# Patient Record
Sex: Female | Born: 1975 | Race: White | Hispanic: No | Marital: Married | Smoking: Never smoker
Health system: Southern US, Community
[De-identification: ages and names within clinical notes are randomized; demographics above are authoritative.]

## PROBLEM LIST (undated history)

## (undated) ENCOUNTER — Inpatient Hospital Stay (HOSPITAL_COMMUNITY): Payer: Self-pay

## (undated) DIAGNOSIS — S82001A Unspecified fracture of right patella, initial encounter for closed fracture: Secondary | ICD-10-CM

## (undated) DIAGNOSIS — R112 Nausea with vomiting, unspecified: Secondary | ICD-10-CM

## (undated) DIAGNOSIS — F329 Major depressive disorder, single episode, unspecified: Secondary | ICD-10-CM

## (undated) DIAGNOSIS — F419 Anxiety disorder, unspecified: Secondary | ICD-10-CM

## (undated) DIAGNOSIS — K5909 Other constipation: Secondary | ICD-10-CM

## (undated) DIAGNOSIS — F32A Depression, unspecified: Secondary | ICD-10-CM

## (undated) DIAGNOSIS — IMO0002 Reserved for concepts with insufficient information to code with codable children: Secondary | ICD-10-CM

## (undated) DIAGNOSIS — T4145XA Adverse effect of unspecified anesthetic, initial encounter: Secondary | ICD-10-CM

## (undated) DIAGNOSIS — Z973 Presence of spectacles and contact lenses: Secondary | ICD-10-CM

## (undated) DIAGNOSIS — Z9889 Other specified postprocedural states: Secondary | ICD-10-CM

## (undated) DIAGNOSIS — T8859XA Other complications of anesthesia, initial encounter: Secondary | ICD-10-CM

## (undated) HISTORY — PX: DILATION AND CURETTAGE OF UTERUS: SHX78

## (undated) HISTORY — PX: WISDOM TOOTH EXTRACTION: SHX21

## (undated) HISTORY — PX: TUBAL LIGATION: SHX77

## (undated) HISTORY — PX: CHOLECYSTECTOMY: SHX55

## (undated) HISTORY — PX: OTHER SURGICAL HISTORY: SHX169

---
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
## 2000-10-19 ENCOUNTER — Emergency Department (HOSPITAL_COMMUNITY): Admission: EM | Admit: 2000-10-19 | Discharge: 2000-10-19 | Payer: Self-pay | Admitting: Emergency Medicine

## 2000-10-25 ENCOUNTER — Ambulatory Visit (HOSPITAL_COMMUNITY): Admission: RE | Admit: 2000-10-25 | Discharge: 2000-10-25 | Payer: Self-pay | Admitting: Family Medicine

## 2000-10-25 ENCOUNTER — Encounter: Payer: Self-pay | Admitting: Unknown Physician Specialty

## 2001-05-29 ENCOUNTER — Emergency Department (HOSPITAL_COMMUNITY): Admission: EM | Admit: 2001-05-29 | Discharge: 2001-05-30 | Payer: Self-pay | Admitting: *Deleted

## 2002-03-09 ENCOUNTER — Emergency Department (HOSPITAL_COMMUNITY): Admission: EM | Admit: 2002-03-09 | Discharge: 2002-03-09 | Payer: Self-pay | Admitting: *Deleted

## 2002-11-15 ENCOUNTER — Emergency Department (HOSPITAL_COMMUNITY): Admission: EM | Admit: 2002-11-15 | Discharge: 2002-11-15 | Payer: Self-pay | Admitting: Emergency Medicine

## 2002-11-15 ENCOUNTER — Encounter: Payer: Self-pay | Admitting: Emergency Medicine

## 2002-11-22 ENCOUNTER — Other Ambulatory Visit: Admission: RE | Admit: 2002-11-22 | Discharge: 2002-11-22 | Payer: Self-pay | Admitting: Dermatology

## 2002-12-18 ENCOUNTER — Emergency Department (HOSPITAL_COMMUNITY): Admission: EM | Admit: 2002-12-18 | Discharge: 2002-12-18 | Payer: Self-pay | Admitting: Emergency Medicine

## 2003-03-05 ENCOUNTER — Emergency Department (HOSPITAL_COMMUNITY): Admission: EM | Admit: 2003-03-05 | Discharge: 2003-03-05 | Payer: Self-pay | Admitting: Emergency Medicine

## 2003-03-30 ENCOUNTER — Ambulatory Visit (HOSPITAL_COMMUNITY): Admission: AD | Admit: 2003-03-30 | Discharge: 2003-03-30 | Payer: Self-pay | Admitting: Obstetrics and Gynecology

## 2003-04-04 ENCOUNTER — Ambulatory Visit (HOSPITAL_COMMUNITY): Admission: AD | Admit: 2003-04-04 | Discharge: 2003-04-04 | Payer: Self-pay | Admitting: Obstetrics and Gynecology

## 2003-04-13 ENCOUNTER — Ambulatory Visit (HOSPITAL_COMMUNITY): Admission: AD | Admit: 2003-04-13 | Discharge: 2003-04-13 | Payer: Self-pay | Admitting: Internal Medicine

## 2003-04-14 ENCOUNTER — Observation Stay (HOSPITAL_COMMUNITY): Admission: AD | Admit: 2003-04-14 | Discharge: 2003-04-15 | Payer: Self-pay | Admitting: Obstetrics and Gynecology

## 2003-11-13 ENCOUNTER — Inpatient Hospital Stay (HOSPITAL_COMMUNITY): Admission: RE | Admit: 2003-11-13 | Discharge: 2003-11-15 | Payer: Self-pay | Admitting: Obstetrics and Gynecology

## 2004-09-05 ENCOUNTER — Emergency Department (HOSPITAL_COMMUNITY): Admission: EM | Admit: 2004-09-05 | Discharge: 2004-09-05 | Payer: Self-pay | Admitting: Emergency Medicine

## 2004-12-27 ENCOUNTER — Emergency Department (HOSPITAL_COMMUNITY): Admission: EM | Admit: 2004-12-27 | Discharge: 2004-12-27 | Payer: Self-pay | Admitting: Emergency Medicine

## 2007-06-20 ENCOUNTER — Emergency Department (HOSPITAL_COMMUNITY): Admission: EM | Admit: 2007-06-20 | Discharge: 2007-06-20 | Payer: Self-pay | Admitting: Emergency Medicine

## 2008-11-04 ENCOUNTER — Emergency Department (HOSPITAL_COMMUNITY): Admission: EM | Admit: 2008-11-04 | Discharge: 2008-11-04 | Payer: Self-pay | Admitting: Emergency Medicine

## 2009-03-22 ENCOUNTER — Emergency Department (HOSPITAL_COMMUNITY): Admission: EM | Admit: 2009-03-22 | Discharge: 2009-03-22 | Payer: Self-pay | Admitting: Emergency Medicine

## 2009-12-27 ENCOUNTER — Emergency Department (HOSPITAL_COMMUNITY): Admission: EM | Admit: 2009-12-27 | Discharge: 2009-12-27 | Payer: Self-pay | Admitting: Emergency Medicine

## 2010-09-29 LAB — URINALYSIS, ROUTINE W REFLEX MICROSCOPIC
Bilirubin Urine: NEGATIVE
Glucose, UA: NEGATIVE mg/dL
Hgb urine dipstick: NEGATIVE
Ketones, ur: NEGATIVE mg/dL
Nitrite: NEGATIVE
Protein, ur: NEGATIVE mg/dL
Specific Gravity, Urine: 1.02 (ref 1.005–1.030)
Urobilinogen, UA: 0.2 mg/dL (ref 0.0–1.0)
pH: 6.5 (ref 5.0–8.0)

## 2010-09-29 LAB — CBC
MCHC: 33.9 g/dL (ref 30.0–36.0)
RDW: 13.5 % (ref 11.5–15.5)

## 2010-09-29 LAB — COMPREHENSIVE METABOLIC PANEL
ALT: 20 U/L (ref 0–35)
AST: 22 U/L (ref 0–37)
Albumin: 4 g/dL (ref 3.5–5.2)
Calcium: 9.1 mg/dL (ref 8.4–10.5)
GFR calc Af Amer: >60 mL/min (ref >60)
Sodium: 139 mEq/L (ref 135–145)
Total Protein: 6.8 g/dL (ref 6.0–8.3)

## 2010-09-29 LAB — DIFFERENTIAL
Eosinophils Absolute: 0.1 10*3/uL (ref 0.0–0.7)
Eosinophils Relative: 1 % (ref 0–5)
Lymphs Abs: 2.3 10*3/uL (ref 0.7–4.0)
Monocytes Relative: 8 % (ref 3–12)

## 2010-10-18 LAB — HCG, QUANTITATIVE, PREGNANCY: hCG, Beta Chain, Quant, S: 37430 m[IU]/mL — ABNORMAL HIGH (ref <5)

## 2010-11-29 NOTE — H&P (Signed)
NAME:  [name], [name]                          ACCOUNT NO.:  [id number]   MEDICAL RECORD NO.:  [id number]                   PATIENT TYPE:  AMB   LOCATION:  DAY                                  FACILITY:  [facility]   PHYSICIAN:  [name], M.D.              DATE OF BIRTH:  Dec 11, 1975   DATE OF ADMISSION:  DATE OF DISCHARGE:                                HISTORY & PHYSICAL   ADMISSION DIAGNOSIS:  Pregnancy, 38-1/[redacted] weeks gestation.  Repeat cesarean  section, not for trial of labor.   HISTORY OF PRESENT ILLNESS:  This 35 year old female, gravida 4, para 2, AB  1, two prior cesarean sections, scheduled for repeat cesarean section on Nov 13, 2003.  Her menstrual EDC is Nov 22, 2003, by very certain LMP and  ultrasound [facility] is Nov 21, 2003 by both first and second trimester  ultrasounds.   Prenatal course has been notable for a 31-pound weight gain, appropriate  fundal height, blood type O positive, antibody screen negative.  Hepatitis,  HIV, GC, Chlamydia, RPR are all negative.  One-hour glucose tolerance test  90 mg/%.  The patient is bottle feeding.  Plan circumcision if it is a female,  though a female is suspected.  Contraception plans are undecided.   PAST MEDICAL HISTORY:  Benign surgical history with cholecystectomy and  cesarean section x2, the initial cesarean section for fetal distress and in  1999 repeat cesarean section performed.   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  Stable  relationship, supportive partner, Loraine Leriche, his first child.   PHYSICAL EXAMINATION:  VITAL SIGNS:  Height 5 feet 8 inches, weight 185,  blood pressure 122/62.  HEENT:  Pupils are equal, round and reactive to light.  Extraocular movement  intact.  NECK:  Supple. Trachea midline.  CHEST:  Clear to auscultation.  ABDOMEN:  Nontender.  In terms of fetus, vertex presentation.  Cervix closed  and high on last exam.   PLAN:  Repeat cesarean section on Nov 13, 2003.     ___________________________________________                                         [name], M.D.   JVF/MEDQ  D:  11/10/2003  T:  11/10/2003  Job:  [id number]   cc:   Francoise Schaumann. Halm, D.O.  [address], [address]  [place]  [place] [address]  Fax: [phone]   Scott A. Gerda Diss, M.D.  [address], [address]  [place]  [place] [address]  Fax: [phone]

## 2010-11-29 NOTE — Discharge Summary (Signed)
   NAMEMALAYNA, [name]                          ACCOUNT NO.:  [id number]   MEDICAL RECORD NO.:  [id number]                   PATIENT TYPE:  OIB   LOCATION:  A415                                 FACILITY:  [facility]   PHYSICIAN:  [name], M.D.              DATE OF BIRTH:  Dec 06, 1975   DATE OF ADMISSION:  04/14/2003  DATE OF DISCHARGE:  04/15/2003                                 DISCHARGE SUMMARY   PROCEDURE:  Observation overnight.   OBSERVATION SUMMARY:  This 35 year old female, at [redacted] weeks gestation, is  admitted from the office yesterday by Jacklyn Shell, C.N.M., for  management of dehydration with a six pound weight loss in three days.  The  patient was admitted with a temperature 97.7, pulse 87, respirations 18,  blood pressure 119/75 and urinalysis in the office showed ketones present.  She was given IV Phenergan with good antinausea effect.  She was observed  overnight, was able to tolerate a breakfast.  Went home on oral Phenergan  tablets and will followup next week in our office.      ___________________________________________                                         [name], M.D.   JVF/MEDQ  D:  04/15/2003  T:  04/16/2003  Job:  [id number]

## 2010-11-29 NOTE — Discharge Summary (Signed)
NAMEEMONII, WIENKE                          ACCOUNT NO.:  [id number]   MEDICAL RECORD NO.:  [id number]                   PATIENT TYPE:  INP   LOCATION:  A402                                 FACILITY:  [facility]   PHYSICIAN:  [name], M.D.              DATE OF BIRTH:  October 31, 1975   DATE OF ADMISSION:  11/13/2003  DATE OF DISCHARGE:                                 DISCHARGE SUMMARY   ADMITTING DIAGNOSES:  1. Pregnancy, 38-1/[redacted] weeks gestation.  2. Repeat cesarean section after trial of labor.   DISCHARGE DIAGNOSES:  Pregnancy.   HISTORY:  A 38-1/2 weeks, delivered, repeat low-transverse cervical cesarean  section, Nov 13, 2003.   DISCHARGE MEDICATIONS:  1. Tylox 15 tablets, one to two q.4h. p.r.n. moderate to severe pain.  2. Motrin 800 mg, one p.o. q.8h. p.r.n. mild pain.  3. Prenatal vitamins one p.o. daily x30 days.   FOLLOWUP:  In four weeks in our office for routine postpartum check, J.B.  Ferguson.   HOSPITAL SUMMARY:  This 35 year old female, gravida 4, para 2, AB 1, with  two prior cesarean section was scheduled for repeat cesarean section.  See  admitting history for details.   HOSPITAL COURSE:  The patient was admitted and underwent an uncomplicated,  repeat, low-transverse cervical cesarean section with removal of the old  cesarean section scars.  The patient had a 7 pound, 8 ounce female infant,  Apgars 10/10.  Intraabdominal findings including some omental adhesions to  anterior uterine wall and to the anterior abdominal wall. The bladder was  high on the uterus.   LABORATORY DATA:  Postoperative hemoglobin was 11.1, hematocrit 33.3  compared to 12.3 and 36.5 preoperatively.   DISPOSITION:  She tolerated a regular diet within 24 hours, and was stable  for discharge at 48 hours with routine postoperative instructions given,  staples removed on day 2, with followup in one month.     ___________________________________________           [name], M.D.   JVF/MEDQ  D:  11/15/2003  T:  11/15/2003  Job:  [id number]   cc:   Lorin Picket A. Gerda Diss, M.D.  [address]  [place]  [place] [address]  Fax: [phone]

## 2010-11-29 NOTE — Op Note (Signed)
[name], [name]                          ACCOUNT NO.:  [id number]   MEDICAL RECORD NO.:  [id number]                   PATIENT TYPE:  INP   LOCATION:  A402                                 FACILITY:  [facility]   PHYSICIAN:  [name], M.D.              DATE OF BIRTH:  1976-05-16   DATE OF PROCEDURE:  11/13/2003  DATE OF DISCHARGE:                                 OPERATIVE REPORT   PREOPERATIVE DIAGNOSES:  1. Pregnancy at 38-1/[redacted] weeks gestation.  2. Repeat cesarean section, not for trial of labor.   POSTOPERATIVE DIAGNOSES:  1. Pregnancy at 38-1/[redacted] weeks gestation.  2. Repeat cesarean section, not for trial of labor, delivered.   PROCEDURE:  Repeat low transverse cervical cesarean section.   SURGEON:  [name], M.D.   ASSISTANTAmie Critchley   ANESTHESIA:  Spinal.   COMPLICATIONS:  None.   ESTIMATED BLOOD LOSS:  600 cc.   FINDINGS:  A 7 pound 8 ounce female with Apgars of 10 and 10.  Omental  adhesions to anterior uterine wall to anterior abdominal wall.  Bladder high  on lower uterine segment.   SPECIMENS:  None.   INDICATIONS FOR PROCEDURE:  The patient is a 35 year old gravida 3, para 2  with two prior C-sections requesting repeat cesarean section.  The patient  plans no more children but does not request sterilization at this time.   DESCRIPTION OF PROCEDURE:  The patient was taken to the operating room and  prepped and draped in the usual sterile fashion for lower abdominal surgery.  Spinal anesthesia was introduced successfully and good analgesic effect  obtained.   The prior two scars were taken out with a 1-inch wide ellipse of skin to  leave her with one scar line.  The fascia was opened transversely in the  method of Pfannenstiel and rectus muscle split in the midline.  The lower  uterine segment was covered with the bladder being rather firmly adherent to  the lower uterine segment, but we were able to eventually develop the  bladder flap  without significant difficulty.  No bladder trauma was  suspected.  No hematuria noted.  The patient had a transverse uterine  incision extended laterally using index finger traction.  The vertex was  guided through the incision using vacuum extractor to guide the vertex while  fundal pressure was applied.  The body was delivered by using the axilla to  extract the baby and cord clamping performed.  Bulb suctioning of the  pharynx had been performed when the head was out.  The baby cried vigorously  even before the body was completely delivered.   Cord blood samples were obtained and are noted elsewhere in the chart.  The  placenta delivered from its posterior mid-uterine position and was delivered  with intact membranes, except for one small remnant which was extracted from  the lower uterine segment area.  Uterine  irrigation with antibiotic solution  was followed by single layer of running locking closure of the uterus.   Inspection of the anterior uterus near the fundus revealed a rather  extensive amount of omental adhesions both to the anterior uterus as well as  the anterior abdominal wall.  We took these down sharply, being careful to  have careful direct visualization.  It required Kelly clamps across the  omental pedicle and 2-0 chromic ligature.   The bladder flap was reapproximated, pulling it side-to-side in an effort to  reduce the tendency for the bladder flap to march higher and higher on the  uterus.  The anterior peritoneum was closed in a continuous running fashion  using 2-0 chromic, and then the fascia was closed with continuous running 0  Vicryl.  The subcutaneous fatty tissues were reapproximated using four  interrupted sutures of 2-0 plain, with antibiotic irrigation of the  subcutaneous tissue as it had been in deeper layers.  We then proceeded with  staple closure of the skin.   The patient tolerated the procedure well with very minimal blood loss,  estimated  at 600 cc or less.  Condition was good to recovery.      ___________________________________________                                            [name], M.D.   JVF/MEDQ  D:  11/13/2003  T:  11/13/2003  Job:  [id number]   cc:   Lorin Picket A. Gerda Diss, M.D.  [address], [address]  [place]  [place] [address]  Fax: [phone]   Francoise Schaumann. Halm, D.O.  [address]  [place]  [place] [address]  Fax: [phone]

## 2010-12-02 ENCOUNTER — Emergency Department (HOSPITAL_COMMUNITY)
Admission: EM | Admit: 2010-12-02 | Discharge: 2010-12-02 | Disposition: A | Discharge status: Home / Self Care | Payer: Self-pay | Attending: Emergency Medicine | Admitting: Emergency Medicine

## 2010-12-02 DIAGNOSIS — H6690 Otitis media, unspecified, unspecified ear: Secondary | ICD-10-CM | POA: Insufficient documentation

## 2011-03-31 ENCOUNTER — Encounter: Payer: Self-pay | Admitting: *Deleted

## 2011-03-31 ENCOUNTER — Observation Stay (HOSPITAL_COMMUNITY)
Admission: EM | Admit: 2011-03-31 | Discharge: 2011-04-01 | DRG: 781 | Disposition: A | Discharge status: Home / Self Care | Payer: Medicaid Other | Attending: Obstetrics and Gynecology | Admitting: Obstetrics and Gynecology

## 2011-03-31 DIAGNOSIS — E860 Dehydration: Secondary | ICD-10-CM | POA: Diagnosis present

## 2011-03-31 DIAGNOSIS — O211 Hyperemesis gravidarum with metabolic disturbance: Principal | ICD-10-CM | POA: Diagnosis present

## 2011-03-31 LAB — BASIC METABOLIC PANEL
CO2: 20 mEq/L (ref 19–32)
Chloride: 96 mEq/L (ref 96–112)
Creatinine, Ser: 0.69 mg/dL (ref 0.50–1.10)
Potassium: 3.9 mEq/L (ref 3.5–5.1)

## 2011-03-31 LAB — CBC
MCV: 86.7 fL (ref 78.0–100.0)
Platelets: 257 10*3/uL (ref 150–400)
RBC: 4.87 MIL/uL (ref 3.87–5.11)
WBC: 8 10*3/uL (ref 4.0–10.5)

## 2011-03-31 LAB — URINE MICROSCOPIC-ADD ON

## 2011-03-31 LAB — URINALYSIS, ROUTINE W REFLEX MICROSCOPIC
Glucose, UA: NEGATIVE mg/dL
Specific Gravity, Urine: >1.03 — ABNORMAL HIGH (ref 1.005–1.030)
Urobilinogen, UA: 0.2 mg/dL (ref 0.0–1.0)

## 2011-03-31 MED ORDER — ONDANSETRON HCL 4 MG/2ML IJ SOLN
4.0000 mg | Freq: Once | INTRAMUSCULAR | Status: AC
  Administered 2011-03-31: 4 mg via INTRAVENOUS
  Filled 2011-03-31: qty 2

## 2011-03-31 MED ORDER — SODIUM CHLORIDE 0.9 % IV BOLUS (SEPSIS)
1000.0000 mL | Freq: Once | INTRAVENOUS | Status: AC
  Administered 2011-03-31: 1000 mL via INTRAVENOUS

## 2011-03-31 NOTE — ED Provider Notes (Addendum)
History     CSN: [id number] Arrival date & time: 03/31/2011  9:37 PM   Chief Complaint  Patient presents with  . Emesis During Pregnancy     (Include location/radiation/quality/duration/timing/severity/associated sxs/prior treatment) Patient is a 35 y.o. female presenting with vomiting. The history is provided by the patient.  Emesis  This is a new problem. The current episode started more than 2 days ago. The problem occurs 5 to 10 times per day. The problem has not changed since onset.The emesis has an appearance of stomach contents. There has been no fever. Pertinent negatives include no abdominal pain, no chills, no cough, no diarrhea, no fever, no headaches and no URI.   Recently found out that she is pregnant followed by OB GYN and has scheduled [place]. Taking PO meds without relief.  She is G4P3 and had similar symptoms with each pregnancy and denies any other complications of pregnancy. No vag bleeding or ABD pains. Did have some intermittent cramping/ none in the ED. She believes she is about [redacted] weeks pregnant based on LMP  Past Medical History  Diagnosis Date  . Pregnant state, incidental      Past Surgical History  Procedure Date  . Cholecystectomy   . C sections     No family history on file.  History  Substance Use Topics  . Smoking status: Not on file  . Smokeless tobacco: Not on file  . Alcohol Use: No    OB History    Grav Para Term Preterm Abortions TAB SAB Ect Mult Living   1               Review of Systems  Constitutional: Negative for fever and chills.  HENT: Negative for neck pain and neck stiffness.   Eyes: Negative for pain.  Respiratory: Negative for cough and shortness of breath.   Cardiovascular: Negative for chest pain, palpitations and leg swelling.  Gastrointestinal: Positive for nausea and vomiting. Negative for abdominal pain, diarrhea, constipation and blood in stool.  Genitourinary: Negative for dysuria.  Musculoskeletal: Negative for  back pain.  Skin: Negative for rash.  Neurological: Negative for headaches.  All other systems reviewed and are negative.    Allergies  Review of patient's allergies indicates no known allergies.  Home Medications   Current Outpatient Rx  Name Route Sig Dispense Refill  . ONDANSETRON HCL 4 MG PO TABS Oral Take 4 mg by mouth every 8 (eight) hours as needed.      Marland Kitchen PROMETHAZINE HCL 25 MG RE SUPP Rectal Place 25 mg rectally every 6 (six) hours as needed.        Physical Exam    BP 116/86  Pulse 92  Temp(Src) 98 F (36.7 C) (Oral)  Resp 20  Ht 5\' 8"  (1.727 m)  Wt 155 lb (70.308 kg)  BMI 23.57 kg/m2  SpO2 99%  Physical Exam  Constitutional: She is oriented to person, place, and time. She appears well-developed and well-nourished.  HENT:  Head: Normocephalic and atraumatic.  Eyes: Conjunctivae and EOM are normal. Pupils are equal, round, and reactive to light.  Neck: Trachea normal. Neck supple. No thyromegaly present.  Cardiovascular: Normal rate, regular rhythm, S1 normal, S2 normal and normal pulses.     No systolic murmur is present   No diastolic murmur is present  Pulses:      Radial pulses are 2+ on the right side, and 2+ on the left side.  Pulmonary/Chest: Effort normal and breath sounds normal. She has no  wheezes. She has no rhonchi. She has no rales. She exhibits no tenderness.  Abdominal: Soft. Normal appearance and bowel sounds are normal. There is no tenderness. There is no rebound, no CVA tenderness and negative Murphy's sign.  Musculoskeletal:       BLE:s Calves nontender, no cords or erythema, negative Homans sign  Neurological: She is alert and oriented to person, place, and time. She has normal strength. No cranial nerve deficit or sensory deficit. GCS eye subscore is 4. GCS verbal subscore is 5. GCS motor subscore is 6.  Skin: Skin is warm and dry. No rash noted. She is not diaphoretic.  Psychiatric: Her speech is normal.       Cooperative and appropriate     ED Course  [place] bedside Date/Time: 04/01/2011 12:38 AM Performed by: Sunnie Nielsen Authorized by: Sunnie Nielsen Consent: Verbal consent obtained. Risks and benefits: risks, benefits and alternatives were discussed Consent given by: patient Patient understanding: patient states understanding of the procedure being performed Patient consent: the patient's understanding of the procedure matches consent given Procedure consent: procedure consent matches procedure scheduled Patient identity confirmed: verbally with patient Time out: Immediately prior to procedure a "time out" was called to verify the correct patient, procedure, equipment, support staff and site/side marked as required. Comments: Positive preg, US shows enlarged uterus with early IUP/ flicker present, unable to visualize ovaries, no free fluid in pelvis    Results for orders placed during the hospital encounter of 03/31/11  URINALYSIS, ROUTINE W REFLEX MICROSCOPIC      Component Value Range   Color, Urine YELLOW  YELLOW    Appearance CLEAR  CLEAR    Specific Gravity, Urine >1.030 (*) 1.005 - 1.030    pH 6.0  5.0 - 8.0    Glucose, UA NEGATIVE  NEGATIVE (mg/dL)   Hgb urine dipstick MODERATE (*) NEGATIVE    Bilirubin Urine SMALL (*) NEGATIVE    Ketones, ur 40 (*) NEGATIVE (mg/dL)   Protein, ur NEGATIVE  NEGATIVE (mg/dL)   Urobilinogen, UA 0.2  0.0 - 1.0 (mg/dL)   Nitrite NEGATIVE  NEGATIVE    Leukocytes, UA NEGATIVE  NEGATIVE   CBC      Component Value Range   WBC 8.0  4.0 - 10.5 (K/uL)   RBC 4.87  3.87 - 5.11 (MIL/uL)   Hemoglobin 14.4  12.0 - 15.0 (g/dL)   HCT 16.1  09.6 - 04.5 (%)   MCV 86.7  78.0 - 100.0 (fL)   MCH 29.6  26.0 - 34.0 (pg)   MCHC 34.1  30.0 - 36.0 (g/dL)   RDW 40.9  81.1 - 91.4 (%)   Platelets 257  150 - 400 (K/uL)  URINE MICROSCOPIC-ADD ON      Component Value Range   Squamous Epithelial / LPF FEW (*) RARE    WBC, UA 0-2  <3 (WBC/hpf)   RBC / HPF 0-2  <3 (RBC/hpf)   Bacteria, UA RARE  RARE     No results found.  Emesis associated with pregnany  MDM  IVF bolus x 2, UA reviewed and ketones noted. IV zofran provided which improved nausea. Bedside US obtained has enlarged uterus and flicker present c/w early IUP.    Still vomiting in ER. D/w Dr Emelda Fear. - will admit cont IVFs and antiemetics.       Sunnie Nielsen, MD 04/01/11 0040  Sunnie Nielsen, MD 04/01/11 [phone]

## 2011-03-31 NOTE — ED Notes (Signed)
Pregnant, nausea and vomiting for 3 days

## 2011-04-01 ENCOUNTER — Encounter (HOSPITAL_COMMUNITY): Payer: Self-pay

## 2011-04-01 LAB — BASIC METABOLIC PANEL
CO2: 21 mEq/L (ref 19–32)
Glucose, Bld: 148 mg/dL — ABNORMAL HIGH (ref 70–99)
Potassium: 3.6 mEq/L (ref 3.5–5.1)
Sodium: 133 mEq/L — ABNORMAL LOW (ref 135–145)

## 2011-04-01 LAB — GLUCOSE, CAPILLARY

## 2011-04-01 MED ORDER — ACETAMINOPHEN 650 MG RE SUPP: 650.0000 mg | RECTAL | Status: DC | PRN

## 2011-04-01 MED ORDER — DEXTROSE 50 % IV SOLN
INTRAVENOUS | Status: AC
  Filled 2011-04-01: qty 50

## 2011-04-01 MED ORDER — ONDANSETRON HCL 4 MG/2ML IJ SOLN
4.0000 mg | Freq: Four times a day (QID) | INTRAMUSCULAR | Status: DC | PRN
  Administered 2011-04-01 (×3): 4 mg via INTRAVENOUS
  Filled 2011-04-01 (×2): qty 2

## 2011-04-01 MED ORDER — ACETAMINOPHEN 325 MG PO TABS: 650.0000 mg | ORAL_TABLET | ORAL | Status: DC | PRN

## 2011-04-01 MED ORDER — DEXTROSE 50 % IV SOLN: 50.0000 mL | Freq: Once | INTRAVENOUS | Status: DC

## 2011-04-01 MED ORDER — DEXTROSE-NACL 5-0.45 % IV SOLN
INTRAVENOUS | Status: DC
  Administered 2011-04-01: 1000 mL via INTRAVENOUS
  Administered 2011-04-01: 200 mL via INTRAVENOUS
  Administered 2011-04-01: 01:00:00 via INTRAVENOUS

## 2011-04-01 MED ORDER — SODIUM CHLORIDE 0.9 % IV SOLN: INTRAVENOUS | Status: DC

## 2011-04-01 MED ORDER — METOCLOPRAMIDE HCL 10 MG PO TABS: 10.0000 mg | ORAL_TABLET | Freq: Three times a day (TID) | ORAL | Status: AC

## 2011-04-01 MED ORDER — ONDANSETRON HCL 4 MG/2ML IJ SOLN
4.0000 mg | Freq: Once | INTRAMUSCULAR | Status: DC
  Filled 2011-04-01: qty 2

## 2011-04-01 MED ORDER — ZOLPIDEM TARTRATE 5 MG PO TABS
5.0000 mg | ORAL_TABLET | Freq: Every evening | ORAL | Status: DC | PRN
  Administered 2011-04-01: 5 mg via ORAL
  Filled 2011-04-01: qty 1

## 2011-04-01 MED ORDER — SODIUM CHLORIDE 0.9 % IV BOLUS (SEPSIS)
1000.0000 mL | Freq: Once | INTRAVENOUS | Status: AC
  Administered 2011-04-01: 1000 mL via INTRAVENOUS

## 2011-04-01 MED ORDER — GLYCOPYRROLATE 2 MG PO TABS: 2.0000 mg | ORAL_TABLET | Freq: Three times a day (TID) | ORAL | Status: DC

## 2011-04-01 NOTE — Progress Notes (Signed)
Patient d/c home No c/o pain at d/c  Left floor via wheelchair accompanied by staff Verbalized understanding of d/c appt & follow up appt Darden Flemister, Kae Heller

## 2011-04-01 NOTE — H&P (Signed)
[name] is a 35 y.o. female presenting for hyperemesis. She has it with each pregnancy. She has sought pnc thru [facility] care center.  She plans to transfer care to [facility]. She has been hydrated thru the day, and is still slightly nauseous but able to keep adequate fluids down to desire to go home for out pt tx.  History OB History    Grav Para Term Preterm Abortions TAB SAB Ect Mult Living   5 3 3  0 1 1 0 0 0 3     Past Medical History  Diagnosis Date  . Pregnant state, incidental    Past Surgical History  Procedure Date  . Cholecystectomy   . C sections    Family History: family history is not on file. Social History:  reports that she has never smoked. She has never used smokeless tobacco. She reports that she does not drink alcohol or use illicit drugs.  ROSReview of Systems - Negative except fpr nausea since last Friday. Has tried phenergan suppos and oral ondansetron Genito-Urinary ROS: Was unable to void initiallly on admit, now voided several times today    Blood pressure 100/64, pulse 74, temperature 98.4 F (36.9 C), temperature source Oral, resp. rate 19, height 5\' 8"  (1.727 m), weight 71.9 kg (158 lb 8.2 oz), SpO2 99.00%. Exam Physical Exam Physical Examination: General appearance - alert, well appearing, and in no distress, normal appearing weight, well hydrated and experiencing some nausea/ Abdomen - soft, nontender, nondistended, no masses or organomegaly Extremities - peripheral pulses normal, no pedal edema, no clubbing or cyanosis, reflexes 3+ Prenatal labs: ABO, Rh:   Antibody:   Rubella:   RPR:    HBsAg:   labs pending thru Dr Mora Appl HIV:    GBS:     Assessment/Plan:Pregnancy 7 weeks, Hyperemesis.                               Resolved dehydration  Kaelene Elliston V 04/01/2011, 5:22 PM

## 2011-04-01 NOTE — ED Notes (Signed)
Pt vomiting, physician advised.

## 2011-04-01 NOTE — Discharge Summary (Signed)
Physician Discharge Summary  Patient ID: [name] MRN: [id number] DOB/AGE: 09/16/1975 35 y.o. Primary Care Physician:LUKING,SCOTT, MD, MD Admit date: 03/31/2011 Discharge date: 04/01/2011    Discharge Diagnoses: Hyperemesis, pregnancy 7 wks  Active Problems:  * No active hospital problems. *    Current Discharge Medication List    START taking these medications   Details  glycopyrrolate (ROBINUL-FORTE) 2 MG tablet Take 1 tablet (2 mg total) by mouth 3 (three) times daily. Qty: 30 tablet, Refills: 2    metoCLOPramide (REGLAN) 10 MG tablet Take 1 tablet (10 mg total) by mouth 3 (three) times daily with meals. Qty: 30 tablet, Refills: 2      CONTINUE these medications which have NOT CHANGED   Details  ondansetron (ZOFRAN) 4 MG tablet Take 4 mg by mouth every 8 (eight) hours as needed.      promethazine (PHENERGAN) 25 MG suppository Place 25 mg rectally every 6 (six) hours as needed.          Discharged Condition:improved    Consults:none  Significant Diagnostic Studies: No results found.  Lab Results: Results for orders placed during the hospital encounter of 03/31/11 (from the past 48 hour(s))  URINALYSIS, ROUTINE W REFLEX MICROSCOPIC     Status: Abnormal   Collection Time   03/31/11 10:08 PM      Component Value Range Comment   Color, Urine YELLOW  YELLOW     Appearance CLEAR  CLEAR     Specific Gravity, Urine >1.030 (*) 1.005 - 1.030     pH 6.0  5.0 - 8.0     Glucose, UA NEGATIVE  NEGATIVE (mg/dL)    Hgb urine dipstick MODERATE (*) NEGATIVE     Bilirubin Urine SMALL (*) NEGATIVE     Ketones, ur 40 (*) NEGATIVE (mg/dL)    Protein, ur NEGATIVE  NEGATIVE (mg/dL)    Urobilinogen, UA 0.2  0.0 - 1.0 (mg/dL)    Nitrite NEGATIVE  NEGATIVE     Leukocytes, UA NEGATIVE  NEGATIVE    CBC     Status: Normal   Collection Time   03/31/11 10:08 PM      Component Value Range Comment   WBC 8.0  4.0 - 10.5 (K/uL)    RBC 4.87  3.87 - 5.11 (MIL/uL)    Hemoglobin 14.4   12.0 - 15.0 (g/dL)    HCT 91.4  78.2 - 95.6 (%)    MCV 86.7  78.0 - 100.0 (fL)    MCH 29.6  26.0 - 34.0 (pg)    MCHC 34.1  30.0 - 36.0 (g/dL)    RDW 21.3  08.6 - 57.8 (%)    Platelets 257  150 - 400 (K/uL)   BASIC METABOLIC PANEL     Status: Abnormal   Collection Time   03/31/11 10:08 PM      Component Value Range Comment   Sodium 133 (*) 135 - 145 (mEq/L)    Potassium 3.9  3.5 - 5.1 (mEq/L)    Chloride 96  96 - 112 (mEq/L)    CO2 20  19 - 32 (mEq/L)    Glucose, Bld 69 (*) 70 - 99 (mg/dL)    BUN 11  6 - 23 (mg/dL)    Creatinine, Ser 4.69  0.50 - 1.10 (mg/dL)    Calcium 9.0  8.4 - 10.5 (mg/dL)    GFR calc non Af Amer >60  >60 (mL/min)    GFR calc Af Amer >60  >60 (mL/min)   URINE MICROSCOPIC-ADD  ON     Status: Abnormal   Collection Time   03/31/11 10:08 PM      Component Value Range Comment   Squamous Epithelial / LPF FEW (*) RARE     WBC, UA 0-2  <3 (WBC/hpf)    RBC / HPF 0-2  <3 (RBC/hpf)    Bacteria, UA RARE  RARE    BASIC METABOLIC PANEL     Status: Abnormal   Collection Time   04/01/11  5:36 AM      Component Value Range Comment   Sodium 133 (*) 135 - 145 (mEq/L)    Potassium 3.6  3.5 - 5.1 (mEq/L)    Chloride 105  96 - 112 (mEq/L) DELTA CHECK NOTED   CO2 21  19 - 32 (mEq/L)    Glucose, Bld 148 (*) 70 - 99 (mg/dL)    BUN 8  6 - 23 (mg/dL)    Creatinine, Ser 1.19  0.50 - 1.10 (mg/dL)    Calcium 7.6 (*) 8.4 - 10.5 (mg/dL)    GFR calc non Af Amer >60  >60 (mL/min)    GFR calc Af Amer >60  >60 (mL/min)   GLUCOSE, CAPILLARY     Status: Abnormal   Collection Time   04/01/11  7:59 AM      Component Value Range Comment   Glucose-Capillary 131 (*) 70 - 99 (mg/dL)    Comment 1 Notify RN      No results found for this or any previous visit (from the past 240 hour(s)).   Hospital Course: iv hydration x  18 hours  Discharge Exam: Blood pressure 100/64, pulse 74, temperature 98.4 F (36.9 C), temperature source Oral, resp. rate 19, height 5\' 8"  (1.727 m), weight 71.9 kg (158  lb 8.2 oz), SpO2 99.00%.   Disposition: home care  Discharge Orders    Future Orders Please Complete By Expires   Diet - low sodium heart healthy      Increase activity slowly      Discharge instructions      Comments:   Full liquids , keep appt in 10-14 days at [facility]   Call MD for:  temperature >100.4      Call MD for:  persistant nausea and vomiting      Comments:   Call office early in morning if nauseous and feeling dehydration developing      Follow-up Information    Follow up with [name], MD in 10 days.   Contact information:   [facility] [address] [place] [address] [phone]          Signed: [name] 04/01/2011, 5:56 PM

## 2011-04-08 ENCOUNTER — Inpatient Hospital Stay (HOSPITAL_COMMUNITY)
Admission: EM | Admit: 2011-04-08 | Discharge: 2011-04-11 | DRG: 781 | Disposition: A | Discharge status: Home / Self Care | Payer: Medicaid Other | Attending: Obstetrics and Gynecology | Admitting: Obstetrics and Gynecology

## 2011-04-08 ENCOUNTER — Encounter (HOSPITAL_COMMUNITY): Payer: Self-pay

## 2011-04-08 DIAGNOSIS — O211 Hyperemesis gravidarum with metabolic disturbance: Principal | ICD-10-CM | POA: Diagnosis present

## 2011-04-08 DIAGNOSIS — E860 Dehydration: Secondary | ICD-10-CM | POA: Diagnosis present

## 2011-04-08 LAB — DIFFERENTIAL
Eosinophils Relative: 0 % (ref 0–5)
Lymphocytes Relative: 19 % (ref 12–46)
Lymphs Abs: 1.7 10*3/uL (ref 0.7–4.0)
Monocytes Absolute: 0.8 10*3/uL (ref 0.1–1.0)

## 2011-04-08 LAB — BASIC METABOLIC PANEL
CO2: 18 mEq/L — ABNORMAL LOW (ref 19–32)
Chloride: 95 mEq/L — ABNORMAL LOW (ref 96–112)
Potassium: 3.8 mEq/L (ref 3.5–5.1)
Sodium: 133 mEq/L — ABNORMAL LOW (ref 135–145)

## 2011-04-08 LAB — ABO/RH: ABO/RH(D): O POS

## 2011-04-08 LAB — CBC
HCT: 43.4 % (ref 36.0–46.0)
MCV: 85.4 fL (ref 78.0–100.0)
Platelets: 261 10*3/uL (ref 150–400)
RBC: 5.08 MIL/uL (ref 3.87–5.11)
WBC: 9 10*3/uL (ref 4.0–10.5)

## 2011-04-08 MED ORDER — METOCLOPRAMIDE HCL 5 MG/ML IJ SOLN
10.0000 mg | Freq: Once | INTRAMUSCULAR | Status: AC
  Administered 2011-04-08: 10 mg via INTRAVENOUS
  Filled 2011-04-08: qty 2

## 2011-04-08 MED ORDER — ONDANSETRON HCL 4 MG/2ML IJ SOLN
4.0000 mg | Freq: Once | INTRAMUSCULAR | Status: AC
  Administered 2011-04-08: 4 mg via INTRAVENOUS
  Filled 2011-04-08: qty 2

## 2011-04-08 MED ORDER — SODIUM CHLORIDE 0.9 % IV SOLN
Freq: Once | INTRAVENOUS | Status: AC
  Administered 2011-04-08: 21:00:00 via INTRAVENOUS

## 2011-04-08 MED ORDER — SODIUM CHLORIDE 0.9 % IV BOLUS (SEPSIS)
1000.0000 mL | Freq: Once | INTRAVENOUS | Status: AC
  Administered 2011-04-08: 1000 mL via INTRAVENOUS

## 2011-04-08 NOTE — ED Notes (Signed)
C/o vomiting since Sunday. Pt states she has zofran from [facility] office and it is not helping. Pt unable to keep anything down. Called Dr. Emelda Fear and was instructed to come here.

## 2011-04-08 NOTE — ED Provider Notes (Signed)
History     CSN: [id number] Arrival date & time: 04/08/2011  9:00 PM  Chief Complaint  Patient presents with  . Emesis During Pregnancy    HPI  (Consider location/radiation/quality/duration/timing/severity/associated sxs/prior treatment)  Patient is a 35 y.o. female presenting with vomiting. The history is provided by the patient.  Emesis  This is a recurrent problem. Episode onset: Four days ago. The problem has not changed since onset.The emesis has an appearance of stomach contents and bilious material. There has been no fever. Pertinent negatives include no abdominal pain, no arthralgias, no fever and no headaches. Associated symptoms comments: Weakness,  Lightheadedness,  Abdominal soreness from vomiting.. Risk factors: she is currently [redacted] weeks pregnant,  and was admitted here last week for the hyperemesis gravidarum.  she has tried oral zofran,  phenergan suppositories and reglan.  She has been unable to keep any of her medicines down and the rectal supp does not work..    Past Medical History  Diagnosis Date  . Pregnant state, incidental     Past Surgical History  Procedure Date  . Cholecystectomy   . C sections     No family history on file.  History  Substance Use Topics  . Smoking status: Never Smoker   . Smokeless tobacco: Never Used  . Alcohol Use: No    OB History    Grav Para Term Preterm Abortions TAB SAB Ect Mult Living   5 3 3  0 1 1 0 0 0 3      Review of Systems  Review of Systems  Constitutional: Negative for fever.  HENT: Negative for congestion, sore throat and neck pain.   Eyes: Negative.   Respiratory: Negative for chest tightness and shortness of breath.   Cardiovascular: Negative for chest pain.  Gastrointestinal: Positive for vomiting. Negative for nausea and abdominal pain.  Genitourinary: Negative.   Musculoskeletal: Negative for joint swelling and arthralgias.  Skin: Negative.  Negative for rash and wound.  Neurological: Positive for  weakness. Negative for dizziness, light-headedness, numbness and headaches.  Hematological: Negative.   Psychiatric/Behavioral: Negative.     Allergies  Review of patient's allergies indicates no known allergies.  Home Medications   Current Outpatient Rx  Name Route Sig Dispense Refill  . GLYCOPYRROLATE 2 MG PO TABS Oral Take 1 tablet (2 mg total) by mouth 3 (three) times daily. 30 tablet 2  . METOCLOPRAMIDE HCL 10 MG PO TABS Oral Take 1 tablet (10 mg total) by mouth 3 (three) times daily with meals. 30 tablet 2  . ONDANSETRON HCL 4 MG PO TABS Oral Take 4 mg by mouth every 8 (eight) hours as needed.      Marland Kitchen PROMETHAZINE HCL 25 MG RE SUPP Rectal Place 25 mg rectally every 6 (six) hours as needed.        Physical Exam    BP 105/67  Pulse 81  Temp(Src) 98.3 F (36.8 C) (Oral)  Resp 17  Ht 5\' 8"  (1.727 m)  Wt 155 lb (70.308 kg)  BMI 23.57 kg/m2  SpO2 99%  Physical Exam  Nursing note and vitals reviewed. Constitutional: She is oriented to person, place, and time. She appears well-developed and well-nourished. She appears distressed.       Appears fatigued.  HENT:  Head: Normocephalic and atraumatic.  Mouth/Throat: Mucous membranes are dry.  Eyes: Conjunctivae are normal.  Neck: Normal range of motion.  Cardiovascular: Normal rate, regular rhythm, normal heart sounds and intact distal pulses.   Pulmonary/Chest: Effort normal and  breath sounds normal. She has no wheezes.  Abdominal: Soft. Bowel sounds are normal. There is no tenderness.  Musculoskeletal: Normal range of motion.  Neurological: She is alert and oriented to person, place, and time.  Skin: Skin is warm and dry. No erythema.       Mild skin tenting noted.  Psychiatric: She has a normal mood and affect.    ED Course  Procedures (including critical care time)  Labs Reviewed  BASIC METABOLIC PANEL - Abnormal; Notable for the following:    Sodium 133 (*)    Chloride 95 (*)    CO2 18 (*)    All other components  within normal limits  CBC - Abnormal; Notable for the following:    Hemoglobin 15.4 (*)    All other components within normal limits  HCG, QUANTITATIVE, PREGNANCY - Abnormal; Notable for the following:    hCG, Beta Francene Finders [id number] (*)    All other components within normal limits  DIFFERENTIAL  ABO/RH  URINALYSIS, ROUTINE W REFLEX MICROSCOPIC       MDM Hyperemesis gravidarum with dehydration.  Patient has received NS 2 liters in ed,  Iv zofran,  reglan with continued nasuea,  Weakness and lightheadedness with ambulation.  Call to Dr. Emelda Fear,  Orders given for overnight obs and continued IV fluids.        Candis Musa, PA 04/09/11 1420

## 2011-04-08 NOTE — ED Notes (Signed)
Pt states she is feeling better at this time. No needs voiced.

## 2011-04-09 LAB — URINALYSIS, ROUTINE W REFLEX MICROSCOPIC
Specific Gravity, Urine: >1.03 — ABNORMAL HIGH (ref 1.005–1.030)
Urobilinogen, UA: 0.2 mg/dL (ref 0.0–1.0)

## 2011-04-09 LAB — URINE MICROSCOPIC-ADD ON

## 2011-04-09 MED ORDER — KCL IN DEXTROSE-NACL 20-5-0.45 MEQ/L-%-% IV SOLN
INTRAVENOUS | Status: DC
  Administered 2011-04-09: 175 mL via INTRAVENOUS
  Administered 2011-04-09 (×2): via INTRAVENOUS

## 2011-04-09 MED ORDER — SODIUM CHLORIDE 0.9 % IJ SOLN
INTRAMUSCULAR | Status: AC
  Administered 2011-04-09: 22:00:00
  Filled 2011-04-09: qty 10

## 2011-04-09 MED ORDER — SODIUM CHLORIDE 0.9 % IJ SOLN
INTRAMUSCULAR | Status: AC
  Administered 2011-04-09: 10 mL
  Filled 2011-04-09: qty 10

## 2011-04-09 MED ORDER — SODIUM CHLORIDE 0.9 % IJ SOLN
INTRAMUSCULAR | Status: AC
  Administered 2011-04-09: 17:00:00
  Filled 2011-04-09: qty 10

## 2011-04-09 MED ORDER — KCL IN DEXTROSE-NACL 20-5-0.45 MEQ/L-%-% IV SOLN
INTRAVENOUS | Status: AC
  Filled 2011-04-09: qty 1000

## 2011-04-09 MED ORDER — DEXTROSE-NACL 5-0.45 % IV SOLN: INTRAVENOUS | Status: AC

## 2011-04-09 MED ORDER — PROMETHAZINE HCL 25 MG/ML IJ SOLN
12.5000 mg | INTRAMUSCULAR | Status: DC | PRN
  Administered 2011-04-09 – 2011-04-10 (×6): 12.5 mg via INTRAVENOUS
  Filled 2011-04-09 (×6): qty 1

## 2011-04-09 NOTE — H&P (Signed)
[name] is an 35 y.o. female.at [redacted] weeks gestation, admitted for Hyperemesis, after a 3 day sequence of nausea and vomiting. Prenatal care thru [facility] . Pertinent Gynecological History:Menstrual History: Menarche age:  No LMP recorded. Patient is pregnant.    Past Medical History  Diagnosis Date  . Pregnant state, incidental     Past Surgical History  Procedure Date  . Cholecystectomy   . C sections     No family history on file.  Social History:  reports that she has never smoked. She has never used smokeless tobacco. She reports that she does not drink alcohol or use illicit drugs.  Allergies: No Known Allergies  Prescriptions prior to admission  Medication Sig Dispense Refill  . glycopyrrolate (ROBINUL-FORTE) 2 MG tablet Take 1 tablet (2 mg total) by mouth 3 (three) times daily.  30 tablet  2  . metoCLOPramide (REGLAN) 10 MG tablet Take 1 tablet (10 mg total) by mouth 3 (three) times daily with meals.  30 tablet  2  . ondansetron (ZOFRAN) 4 MG tablet Take 4 mg by mouth every 8 (eight) hours as needed.        . promethazine (PHENERGAN) 25 MG suppository Place 25 mg rectally every 6 (six) hours as needed.          ROS  Blood pressure 123/80, pulse 87, temperature 98.8 F (37.1 C), temperature source Oral, resp. rate 18, height 5\' 8"  (1.727 m), weight 69.5 kg (153 lb 3.5 oz), SpO2 98.00%. Physical Exam Physical Examination: General appearance - alert, well appearing, and in no distress and slight dry mucuc membranes. No postural dizziness.  Has not voided but 2 x today. Abdomen - soft, nontender, nondistended, no masses or organomegaly Extremities - peripheral pulses normal, no pedal edema, no clubbing or cyanosis, intact peripheral pulses  Results for orders placed during the hospital encounter of 04/08/11 (from the past 24 hour(s))  BASIC METABOLIC PANEL     Status: Abnormal   Collection Time   04/08/11  9:45 PM      Component Value Range   Sodium 133  (*) 135 - 145 (mEq/L)   Potassium 3.8  3.5 - 5.1 (mEq/L)   Chloride 95 (*) 96 - 112 (mEq/L)   CO2 18 (*) 19 - 32 (mEq/L)   Glucose, Bld 71  70 - 99 (mg/dL)   BUN 10  6 - 23 (mg/dL)   Creatinine, Ser 1.61  0.50 - 1.10 (mg/dL)   Calcium 09.6  8.4 - 10.5 (mg/dL)   GFR calc non Af Amer >60  >60 (mL/min)   GFR calc Af Amer >60  >60 (mL/min)  CBC     Status: Abnormal   Collection Time   04/08/11  9:45 PM      Component Value Range   WBC 9.0  4.0 - 10.5 (K/uL)   RBC 5.08  3.87 - 5.11 (MIL/uL)   Hemoglobin 15.4 (*) 12.0 - 15.0 (g/dL)   HCT 04.5  40.9 - 81.1 (%)   MCV 85.4  78.0 - 100.0 (fL)   MCH 30.3  26.0 - 34.0 (pg)   MCHC 35.5  30.0 - 36.0 (g/dL)   RDW 91.4  78.2 - 95.6 (%)   Platelets 261  150 - 400 (K/uL)  DIFFERENTIAL     Status: Normal   Collection Time   04/08/11  9:45 PM      Component Value Range   Neutrophils Relative 72  43 - 77 (%)   Neutro Abs  6.4  1.7 - 7.7 (K/uL)   Lymphocytes Relative 19  12 - 46 (%)   Lymphs Abs 1.7  0.7 - 4.0 (K/uL)   Monocytes Relative 9  3 - 12 (%)   Monocytes Absolute 0.8  0.1 - 1.0 (K/uL)   Eosinophils Relative 0  0 - 5 (%)   Eosinophils Absolute 0.0  0.0 - 0.7 (K/uL)   Basophils Relative 0  0 - 1 (%)   Basophils Absolute 0.0  0.0 - 0.1 (K/uL)  ABO/RH     Status: Normal   Collection Time   04/08/11  9:59 PM      Component Value Range   ABO/RH(D) O POS    HCG, QUANTITATIVE, PREGNANCY     Status: Abnormal   Collection Time   04/08/11 10:15 PM      Component Value Range   hCG, Beta Chain, Mahalia Longest [id number] (*) <5 (mIU/mL)  URINALYSIS, ROUTINE W REFLEX MICROSCOPIC     Status: Abnormal   Collection Time   04/08/11 11:50 PM      Component Value Range   Color, Urine AMBER (*) YELLOW    Appearance CLEAR  CLEAR    Specific Gravity, Urine >1.030 (*) 1.005 - 1.030    pH 6.0  5.0 - 8.0    Glucose, UA NEGATIVE  NEGATIVE (mg/dL)   Hgb urine dipstick LARGE (*) NEGATIVE    Bilirubin Urine SMALL (*) NEGATIVE    Ketones, ur >80 (*) NEGATIVE (mg/dL)    Protein, ur NEGATIVE  NEGATIVE (mg/dL)   Urobilinogen, UA 0.2  0.0 - 1.0 (mg/dL)   Nitrite NEGATIVE  NEGATIVE    Leukocytes, UA NEGATIVE  NEGATIVE   URINE MICROSCOPIC-ADD ON     Status: Abnormal   Collection Time   04/08/11 11:50 PM      Component Value Range   Squamous Epithelial / LPF MANY (*) RARE    WBC, UA 0-2  <3 (WBC/hpf)   RBC / HPF 3-6  <3 (RBC/hpf)   Bacteria, UA MANY (*) RARE     No results found.  Assessment/Plan: Hyperemesis gravidarum, mild dehydration  Iv fluids, antiemetics,  Observe x 24 hours.  Sheila Ocasio V 04/09/2011, 9:12 PM

## 2011-04-09 NOTE — ED Provider Notes (Signed)
Medical screening examination/treatment/procedure(s) were performed by non-physician practitioner and as supervising physician I was immediately available for consultation/collaboration.   Tyre Beaver L [place] Ice, MD 04/09/11 1537 

## 2011-04-09 NOTE — ED Notes (Signed)
Pt states nausea is much better at this time.

## 2011-04-10 ENCOUNTER — Observation Stay (HOSPITAL_COMMUNITY): Payer: Medicaid Other

## 2011-04-10 MED ORDER — SODIUM CHLORIDE 0.9 % IJ SOLN
INTRAMUSCULAR | Status: AC
  Administered 2011-04-10: 22:00:00
  Filled 2011-04-10: qty 10

## 2011-04-10 MED ORDER — METOCLOPRAMIDE HCL 10 MG/10ML PO SOLN
10.0000 mg | Freq: Three times a day (TID) | ORAL | Status: DC
Start: 2011-04-10 — End: 2011-04-10
  Filled 2011-04-10 (×5): qty 10

## 2011-04-10 MED ORDER — SODIUM CHLORIDE 0.9 % IJ SOLN
INTRAMUSCULAR | Status: AC
  Administered 2011-04-10: 10 mL
  Filled 2011-04-10: qty 10

## 2011-04-10 MED ORDER — KCL IN DEXTROSE-NACL 20-5-0.45 MEQ/L-%-% IV SOLN
INTRAVENOUS | Status: DC
  Administered 2011-04-10 (×2): 75 mL via INTRAVENOUS
  Administered 2011-04-10 – 2011-04-11 (×2): via INTRAVENOUS

## 2011-04-10 MED ORDER — PROMETHAZINE HCL 25 MG RE SUPP: 25.0000 mg | Freq: Four times a day (QID) | RECTAL | Status: DC | PRN

## 2011-04-10 MED ORDER — GLYCOPYRROLATE 1 MG PO TABS
2.0000 mg | ORAL_TABLET | Freq: Three times a day (TID) | ORAL | Status: DC
  Administered 2011-04-10 (×3): 2 mg via ORAL
  Filled 2011-04-10 (×4): qty 2

## 2011-04-10 MED ORDER — METOCLOPRAMIDE HCL 5 MG/5ML PO SOLN
10.0000 mg | Freq: Three times a day (TID) | ORAL | Status: DC
  Administered 2011-04-10 (×2): 10 mg via ORAL
  Filled 2011-04-10 (×4): qty 10

## 2011-04-10 NOTE — Progress Notes (Signed)
[name] is continuing to vomit with all p.o intake.  Pt now rehydrated, afebrile with nontender abdomen, hypoactive BS.  Pt drooling, Pt having slight vag discharge.  Qhcg 125000,    ROS Slight hunger, desires to increase po intake.  Physical Exam Physical Examination: General appearance - alert, well appearing, and in no distress and well hydrated Abdomen - soft, nontender, nondistended, no masses or organomegaly bowel sounds hypoactive Skin - normal coloration and turgor, no rashes, no suspicious skin lesions noted  A/P Hyperemesis [redacted] weeks gestation  Add Reglan10 mg ac and hs, and add glycopyrrolate.2 mg tid, add PR phenergan. Full liq diet Probable d/c Friday. [name] 04/10/2011

## 2011-04-10 NOTE — Progress Notes (Signed)
UR Chart Review Completed  

## 2011-04-11 MED ORDER — PROMETHAZINE HCL 25 MG RE SUPP: 25.0000 mg | Freq: Four times a day (QID) | RECTAL | Status: DC | PRN

## 2011-04-11 NOTE — Progress Notes (Signed)
Subjective: Patient reports nausea has improved a little bit.  Tolerated p.o. Pudding yesterday. +BM voiding normally.    Objective: I have reviewed patient's vital signs and intake and output.  General: well hydrated GI: soft, non-tender; bowel sounds normal; no masses,  no organomegaly and normal findings: soft, non-tender   Assessment/Plan: Hyperemesis Gravidarum, stable Pregnancy 8+ weeks stable  Willl discharge home today on Phenergan suppos, Zofran ODT, Reglan, Robinul,   LOS: 3 days    [name] V 04/11/2011, 7:18 AM

## 2011-04-11 NOTE — Discharge Summary (Signed)
  Admit:  Hyperemesis with dehydration, pregnancy 7+ weeks Dischage:  Hyperemesis Gravidarum stable                    Dehydration resolved                    Intrauterine pregnancy 8 weeks Disch meds see med rec F/u 1 week  [facility]

## 2011-04-11 NOTE — Progress Notes (Signed)
Encounter addended by: Clarene Critchley on: 04/11/2011  9:51 AM<BR>     Documentation filed: Flowsheet VN

## 2011-04-11 NOTE — Progress Notes (Signed)
Pt discharged home via family; Pt and family given and explained all discharge instructions, carenotes, and prescriptions; pt and family stated understanding and denied questions/concerns; all f/u appointments in place; IV removed without complicaitons; pt stable at time of discharge  

## 2011-04-14 LAB — OB RESULTS CONSOLE HIV ANTIBODY (ROUTINE TESTING): HIV: NONREACTIVE

## 2011-04-14 LAB — OB RESULTS CONSOLE RUBELLA ANTIBODY, IGM: Rubella: NON-IMMUNE/NOT IMMUNE

## 2011-04-14 LAB — OB RESULTS CONSOLE RPR: RPR: NONREACTIVE

## 2011-04-18 ENCOUNTER — Encounter (HOSPITAL_COMMUNITY): Payer: Medicaid Other | Attending: Obstetrics and Gynecology

## 2011-04-18 ENCOUNTER — Other Ambulatory Visit: Payer: Self-pay | Admitting: Obstetrics and Gynecology

## 2011-04-18 DIAGNOSIS — E860 Dehydration: Secondary | ICD-10-CM | POA: Insufficient documentation

## 2011-04-18 DIAGNOSIS — O211 Hyperemesis gravidarum with metabolic disturbance: Secondary | ICD-10-CM | POA: Insufficient documentation

## 2011-04-18 DIAGNOSIS — R112 Nausea with vomiting, unspecified: Secondary | ICD-10-CM

## 2011-04-18 LAB — URINALYSIS, ROUTINE W REFLEX MICROSCOPIC
Glucose, UA: NEGATIVE mg/dL
Ketones, ur: 15 mg/dL — AB
Leukocytes, UA: NEGATIVE
Protein, ur: NEGATIVE mg/dL
pH: 6 (ref 5.0–8.0)

## 2011-04-18 MED ORDER — SODIUM CHLORIDE 0.9 % IV SOLN
INTRAVENOUS | Status: DC
  Administered 2011-04-18: 13:00:00 via INTRAVENOUS

## 2011-04-18 MED ORDER — SODIUM CHLORIDE 0.9 % IJ SOLN
10.0000 mL | INTRAMUSCULAR | Status: DC | PRN
  Filled 2011-04-18: qty 10

## 2011-04-18 MED ORDER — PROMETHAZINE HCL 25 MG/ML IJ SOLN
INTRAMUSCULAR | Status: AC
  Administered 2011-04-18: 12.5 mg via INTRAVENOUS
  Filled 2011-04-18: qty 1

## 2011-04-18 MED ORDER — SODIUM CHLORIDE 0.9 % IJ SOLN
INTRAMUSCULAR | Status: AC
  Filled 2011-04-18: qty 10

## 2011-04-18 MED ORDER — PROMETHAZINE HCL 25 MG/ML IJ SOLN
12.5000 mg | INTRAMUSCULAR | Status: DC | PRN
  Administered 2011-04-18 (×2): 12.5 mg via INTRAVENOUS

## 2011-04-21 LAB — URINE MICROSCOPIC-ADD ON

## 2011-04-21 LAB — URINALYSIS, ROUTINE W REFLEX MICROSCOPIC
Glucose, UA: NEGATIVE
Specific Gravity, Urine: >1.03 — ABNORMAL HIGH
pH: 5.5

## 2011-04-21 LAB — PREGNANCY, URINE: Preg Test, Ur: POSITIVE

## 2011-05-09 ENCOUNTER — Encounter (HOSPITAL_COMMUNITY): Payer: Self-pay

## 2011-05-09 ENCOUNTER — Emergency Department (HOSPITAL_COMMUNITY)
Admission: EM | Admit: 2011-05-09 | Discharge: 2011-05-09 | Disposition: A | Discharge status: Home / Self Care | Payer: Medicaid Other | Attending: Emergency Medicine | Admitting: Emergency Medicine

## 2011-05-09 DIAGNOSIS — O210 Mild hyperemesis gravidarum: Secondary | ICD-10-CM | POA: Insufficient documentation

## 2011-05-09 DIAGNOSIS — E860 Dehydration: Secondary | ICD-10-CM

## 2011-05-09 DIAGNOSIS — R1110 Vomiting, unspecified: Secondary | ICD-10-CM

## 2011-05-09 LAB — URINE MICROSCOPIC-ADD ON

## 2011-05-09 LAB — URINALYSIS, ROUTINE W REFLEX MICROSCOPIC
Glucose, UA: NEGATIVE mg/dL
Ketones, ur: >80 mg/dL — AB
Leukocytes, UA: NEGATIVE
Nitrite: NEGATIVE
Protein, ur: NEGATIVE mg/dL

## 2011-05-09 MED ORDER — ONDANSETRON HCL 4 MG/2ML IJ SOLN
4.0000 mg | Freq: Once | INTRAMUSCULAR | Status: AC
  Administered 2011-05-09: 4 mg via INTRAVENOUS
  Filled 2011-05-09: qty 2

## 2011-05-09 MED ORDER — SODIUM CHLORIDE 0.9 % IV BOLUS (SEPSIS)
1000.0000 mL | Freq: Once | INTRAVENOUS | Status: AC
  Administered 2011-05-09: 1000 mL via INTRAVENOUS

## 2011-05-09 NOTE — ED Provider Notes (Signed)
History   This chart was scribed for Joya Gaskins, MD by Clarita Crane. The patient was seen in room APA03/APA03 and the patient's care was started at 6:05PM.   CSN: [id number] Arrival date & time: 05/09/2011  5:51 PM   First MD Initiated Contact with Patient 05/09/11 1758      Chief Complaint  Patient presents with  . Emesis  . Emesis During Pregnancy    HPI [name] is a 35 y.o. female currently [redacted] weeks pregnant who presents to the Emergency Department complaining of constant moderate to severe nausea and vomiting onset 12 weeks ago but worse last night and persistent since with associated constipation and moderate HA. Patient describes emesis as green in color and states symptoms have not been relieved with use of Phenergan suppository. Denies diarrhea, cough, SOB, chest pain, hematemesis, dysuria, vaginal bleeding.  Patient reports she has been admitted 2x previously from the ED over the past 12 weeks for similar symptoms. Notes she has also been evaluated by Dr. Emelda Fear multiple times since conception for nausea and vomiting. Patient with h/o c-section and cholecystectomy and denies recent EtOH use.  Past Medical History  Diagnosis Date  . Pregnant state, incidental     Past Surgical History  Procedure Date  . Cholecystectomy   . C sections     History reviewed. No pertinent family history.  History  Substance Use Topics  . Smoking status: Never Smoker   . Smokeless tobacco: Never Used  . Alcohol Use: No    OB History    Grav Para Term Preterm Abortions TAB SAB Ect Mult Living   6 3 3  0 2 2 0 0 0 3      Review of Systems 10 Systems reviewed and are negative for acute change except as noted in the HPI.  Allergies  Review of patient's allergies indicates no known allergies.  Home Medications   Current Outpatient Rx  Name Route Sig Dispense Refill  . GLYCOPYRROLATE 2 MG PO TABS Oral Take 1 tablet (2 mg total) by mouth 3 (three) times daily. 30  tablet 2  . ONDANSETRON HCL 4 MG PO TABS Oral Take 4 mg by mouth every 8 (eight) hours as needed.      Marland Kitchen PROMETHAZINE HCL 25 MG RE SUPP Rectal Place 1 suppository (25 mg total) rectally every 6 (six) hours as needed for nausea. 12 suppository 5    BP 106/66  Pulse 96  Temp(Src) 98.7 F (37.1 C) (Oral)  Resp 20  Ht 5' 7.5" (1.715 m)  Wt 150 lb (68.04 kg)  BMI 23.15 kg/m2  SpO2 97%  Physical Exam CONSTITUTIONAL: Well developed/well nourished HEAD AND FACE: Normocephalic/atraumatic EYES: EOMI/PERRL, no scleral icterus ENMT: Mucous membranes moist, mucous membranes dry NECK: supple no meningeal signs CV: S1/S2 noted, no murmurs/rubs/gallops noted LUNGS: Lungs are clear to auscultation bilaterally, no apparent distress ABDOMEN: soft, nontender, no rebound or guarding GU:no cva tenderness NEURO: Pt is awake/alert, moves all extremitiesx4 EXTREMITIES: pulses normal, full ROM, DP pulses intact SKIN: warm, color normal PSYCH: no abnormalities of mood noted  ED Course  Procedures (including critical care time)  DIAGNOSTIC STUDIES: Oxygen Saturation is 97% on room air, normal by my interpretation.    COORDINATION OF CARE:  7:07 PM Patient was much improved she is taking oral fluids a urinalysis shows ketones No obvious signs of UTI Will send for urine culture She has antiemetics at home  Labs Reviewed  URINALYSIS, ROUTINE W REFLEX MICROSCOPIC - Abnormal;  Notable for the following:    Hgb urine dipstick TRACE (*)    Ketones, ur >80 (*)    All other components within normal limits  URINE MICROSCOPIC-ADD ON - Abnormal; Notable for the following:    Squamous Epithelial / LPF MANY (*)    Bacteria, UA MANY (*)    All other components within normal limits   No results found.   No diagnosis found.    MDM  Nursing notes reviewed and considered in documentation All labs/vitals reviewed and considered Previous records reviewed and considered  7:41 PM i have patient  options of staying in ED, further IV fluids and possible admission, but she would like to go home.  No vomiting here.  No abd pain.  She appears improved   I personally performed the services described in this documentation, which was scribed in my presence. The recorded information has been reviewed and considered.     Joya Gaskins, MD 05/09/11 [phone]

## 2011-05-09 NOTE — ED Notes (Signed)
Pt presents with vomiting since last night. Pt is exactly [redacted] weeks pregnant today. Pt OB/GYN has given pt multiple medications for vomiting and pt states none of the medications are working. NAD at this time.

## 2011-05-14 MED ORDER — PROMETHAZINE HCL 25 MG/ML IJ SOLN: 12.5000 mg | INTRAMUSCULAR | Status: DC | PRN

## 2011-05-14 MED ORDER — SODIUM CHLORIDE 0.9 % IV SOLN: INTRAVENOUS | Status: DC

## 2011-05-14 MED ORDER — SODIUM CHLORIDE 0.9 % IV SOLN: 8.0000 mg | Freq: Once | INTRAVENOUS | Status: DC

## 2011-05-14 MED ORDER — SODIUM CHLORIDE 0.9 % IV SOLN: Freq: Once | INTRAVENOUS | Status: DC

## 2011-06-30 ENCOUNTER — Other Ambulatory Visit: Payer: Self-pay | Admitting: Obstetrics & Gynecology

## 2011-06-30 DIAGNOSIS — Z3689 Encounter for other specified antenatal screening: Secondary | ICD-10-CM

## 2011-06-30 DIAGNOSIS — O2690 Pregnancy related conditions, unspecified, unspecified trimester: Secondary | ICD-10-CM

## 2011-07-01 ENCOUNTER — Other Ambulatory Visit: Payer: Self-pay | Admitting: Obstetrics & Gynecology

## 2011-07-01 ENCOUNTER — Ambulatory Visit (HOSPITAL_COMMUNITY)
Admission: RE | Admit: 2011-07-01 | Discharge: 2011-07-01 | Disposition: A | Discharge status: Home / Self Care | Payer: Medicaid Other | Source: Ambulatory Visit | Attending: Obstetrics & Gynecology | Admitting: Obstetrics & Gynecology

## 2011-07-01 DIAGNOSIS — Z3689 Encounter for other specified antenatal screening: Secondary | ICD-10-CM

## 2011-07-01 DIAGNOSIS — O09529 Supervision of elderly multigravida, unspecified trimester: Secondary | ICD-10-CM | POA: Insufficient documentation

## 2011-07-01 DIAGNOSIS — O358XX0 Maternal care for other (suspected) fetal abnormality and damage, not applicable or unspecified: Secondary | ICD-10-CM | POA: Insufficient documentation

## 2011-07-01 DIAGNOSIS — O2690 Pregnancy related conditions, unspecified, unspecified trimester: Secondary | ICD-10-CM

## 2011-07-01 DIAGNOSIS — O34219 Maternal care for unspecified type scar from previous cesarean delivery: Secondary | ICD-10-CM | POA: Insufficient documentation

## 2011-07-01 DIAGNOSIS — Z363 Encounter for antenatal screening for malformations: Secondary | ICD-10-CM | POA: Insufficient documentation

## 2011-07-01 DIAGNOSIS — Z1389 Encounter for screening for other disorder: Secondary | ICD-10-CM | POA: Insufficient documentation

## 2011-07-01 DIAGNOSIS — O10019 Pre-existing essential hypertension complicating pregnancy, unspecified trimester: Secondary | ICD-10-CM | POA: Insufficient documentation

## 2011-07-04 ENCOUNTER — Other Ambulatory Visit: Payer: Self-pay

## 2011-07-10 ENCOUNTER — Encounter (HOSPITAL_COMMUNITY): Payer: Self-pay

## 2011-07-10 ENCOUNTER — Inpatient Hospital Stay (HOSPITAL_COMMUNITY)
Admission: AD | Admit: 2011-07-10 | Discharge: 2011-07-10 | Disposition: A | Discharge status: Home / Self Care | Payer: Medicaid Other | Source: Ambulatory Visit | Attending: Obstetrics & Gynecology | Admitting: Obstetrics & Gynecology

## 2011-07-10 DIAGNOSIS — O219 Vomiting of pregnancy, unspecified: Secondary | ICD-10-CM

## 2011-07-10 DIAGNOSIS — O210 Mild hyperemesis gravidarum: Secondary | ICD-10-CM | POA: Insufficient documentation

## 2011-07-10 DIAGNOSIS — R109 Unspecified abdominal pain: Secondary | ICD-10-CM | POA: Insufficient documentation

## 2011-07-10 MED ORDER — THIAMINE HCL 100 MG/ML IJ SOLN
Freq: Once | INTRAVENOUS | Status: AC
  Administered 2011-07-10: 16:00:00 via INTRAVENOUS
  Filled 2011-07-10: qty 1000

## 2011-07-10 MED ORDER — PROMETHAZINE HCL 25 MG/ML IJ SOLN
25.0000 mg | INTRAVENOUS | Status: DC
  Administered 2011-07-10: 25 mg via INTRAVENOUS
  Filled 2011-07-10: qty 1

## 2011-07-10 MED ORDER — ZOLPIDEM TARTRATE ER 6.25 MG PO TBCR: 6.2500 mg | EXTENDED_RELEASE_TABLET | Freq: Every evening | ORAL | Status: DC | PRN

## 2011-07-10 NOTE — Progress Notes (Signed)
Patient states she started having abdominal cramping 2 days ago that started making her vomit. Vomiting and cramping have continued. Was seen at [facility] today and sent to [facility] for IVF's.

## 2011-07-10 NOTE — ED Provider Notes (Signed)
[name] [id number] @[redacted]w[redacted]d  Chief Complaint  Patient presents with  . Abdominal Cramping  . Emesis    SUBJECTIVE  HPI: She presented to [facility] this morning with lower abdominal cramping and persistent vomiting despite Reglan and Zofran. She is still nauseated and has not attempted to eat since yesterday. She was dehydrated at the office visit with >80 ketones. She feels weak and sometimes dizzy with standing. Not taking any PNV.  Past Medical History  Diagnosis Date  . Pregnant state, incidental   . No pertinent past medical history    Past Surgical History  Procedure Date  . Cholecystectomy   . C sections   . Cesarean section    History   Social History  . Marital Status: Married    Spouse Name: N/A    Number of Children: N/A  . Years of Education: N/A   Occupational History  . Not on file.   Social History Main Topics  . Smoking status: Never Smoker   . Smokeless tobacco: Never Used  . Alcohol Use: No  . Drug Use: No  . Sexually Active: Yes -- Female partner(s)    Birth Control/ Protection: None   Other Topics Concern  . Not on file   Social History Narrative  . No narrative on file   Current Facility-Administered Medications on File Prior to Encounter  Medication Dose Route Frequency Provider Last Rate Last Dose  . 0.9 %  sodium chloride infusion   Intravenous Once [name], MD      . 0.9 %  sodium chloride infusion   Intravenous Continuous [name], MD      . ondansetron [facility]) 8 mg in sodium chloride 0.9 % 50 mL IVPB  8 mg Intravenous Once [name], MD      . promethazine (PHENERGAN) injection 12.5 mg  12.5 mg Intravenous Q4H PRN [name], MD       Current Outpatient Prescriptions on File Prior to Encounter  Medication Sig Dispense Refill  . glycopyrrolate (ROBINUL-FORTE) 2 MG tablet Take 1 tablet (2 mg total) by mouth 3 (three) times daily.  30 tablet  2  . ondansetron (ZOFRAN) 4 MG tablet Take 4 mg by mouth  every 8 (eight) hours as needed. For nausea      . promethazine (PHENERGAN) 25 MG suppository Place 1 suppository (25 mg total) rectally every 6 (six) hours as needed for nausea.  12 suppository  5   No Known Allergies  ROS: Pertinent items in HPI  OBJECTIVE  BP 114/55  Pulse 78  Temp 98.4 F (36.9 C)  Resp 16  Ht 5\' 8"  (1.727 m)  Wt 71.033 kg (156 lb 9.6 oz)  BMI 23.81 kg/m2  SpO2 98% Samantha L Edwards35 [id number] @[redacted]w[redacted]d  Chief Complaint  Patient presents with  . Abdominal Cramping  . Emesis    SUBJECTIVE  HPI:  Past Medical History  Diagnosis Date  . Pregnant state, incidental   . No pertinent past medical history    Past Surgical History  Procedure Date  . Cholecystectomy   . C sections   . Cesarean section    History   Social History  . Marital Status: Married    Spouse Name: N/A    Number of Children: N/A  . Years of Education: N/A   Occupational History  . Not on file.   Social History Main Topics  . Smoking status: Never Smoker   . Smokeless tobacco: Never Used  .  Alcohol Use: No  . Drug Use: No  . Sexually Active: Yes -- Female partner(s)    Birth Control/ Protection: None   Other Topics Concern  . Not on file   Social History Narrative  . No narrative on file   Current Facility-Administered Medications on File Prior to Encounter  Medication Dose Route Frequency Provider Last Rate Last Dose  . 0.9 %  sodium chloride infusion   Intravenous Once [name], MD      . 0.9 %  sodium chloride infusion   Intravenous Continuous [name], MD      . ondansetron [facility]) 8 mg in sodium chloride 0.9 % 50 mL IVPB  8 mg Intravenous Once [name], MD      . promethazine (PHENERGAN) injection 12.5 mg  12.5 mg Intravenous Q4H PRN [name], MD       Current Outpatient Prescriptions on File Prior to Encounter  Medication Sig Dispense Refill  . glycopyrrolate (ROBINUL-FORTE) 2 MG tablet Take 1 tablet (2 mg total) by mouth 3 (three)  times daily.  30 tablet  2  . ondansetron (ZOFRAN) 4 MG tablet Take 4 mg by mouth every 8 (eight) hours as needed. For nausea      . promethazine (PHENERGAN) 25 MG suppository Place 1 suppository (25 mg total) rectally every 6 (six) hours as needed for nausea.  12 suppository  5   No Known Allergies  ROS: Pertinent items in HPI  OBJECTIVE  BP 114/55  Pulse 78  Temp 98.4 F (36.9 C)  Resp 16  Ht 5\' 8"  (1.727 m)  Wt 71.033 kg (156 lb 9.6 oz)  BMI 23.81 kg/m2  SpO2 98%  Physical Exam  Constitutional: She is oriented to person, place, and time and well-developed, well-nourished, and in no distress.       Appears pale and listless  HENT:  Head: Normocephalic.  Eyes: EOM are normal.  Neck: Neck supple.  Cardiovascular: Normal rate.   Pulmonary/Chest: Effort normal.  Abdominal: Soft. There is no tenderness.  Musculoskeletal: Normal range of motion.  Neurological: She is alert and oriented to person, place, and time.  Skin: Skin is warm and dry.  Psychiatric: Affect normal.   [facility] Course:  After second liter of IVF, retaining fluids and feels much better  ASSESSMENT  Nausea and vomiting of pregnancy   PLAN   Discharge home with continuation of her Zofran and Reglan.

## 2011-07-11 NOTE — ED Provider Notes (Signed)
Attestation of Attending Supervision of Advanced Practitioner: Evaluation and management procedures were performed by the PA/NP/CNM/OB Fellow under my supervision/collaboration. Chart reviewed, and agree with management and plan.  Jaynie Collins, M.D. 07/11/2011 3:18 PM

## 2011-11-05 ENCOUNTER — Encounter (HOSPITAL_COMMUNITY): Payer: Self-pay | Admitting: Pharmacist

## 2011-11-06 ENCOUNTER — Encounter (HOSPITAL_COMMUNITY)
Admission: RE | Admit: 2011-11-06 | Discharge: 2011-11-06 | Disposition: A | Discharge status: Home / Self Care | Payer: Medicaid Other | Source: Ambulatory Visit | Attending: Obstetrics and Gynecology | Admitting: Obstetrics and Gynecology

## 2011-11-06 ENCOUNTER — Encounter (HOSPITAL_COMMUNITY): Payer: Self-pay

## 2011-11-06 HISTORY — DX: Reserved for concepts with insufficient information to code with codable children: IMO0002

## 2011-11-06 LAB — CBC
HCT: 35.1 % — ABNORMAL LOW (ref 36.0–46.0)
Hemoglobin: 11.3 g/dL — ABNORMAL LOW (ref 12.0–15.0)
MCH: 28.2 pg (ref 26.0–34.0)
MCHC: 32.2 g/dL (ref 30.0–36.0)
MCV: 87.5 fL (ref 78.0–100.0)
RBC: 4.01 MIL/uL (ref 3.87–5.11)

## 2011-11-06 NOTE — Patient Instructions (Addendum)
   Your procedure is scheduled on: Friday May 3rd  Enter through the Main Entrance of [facility] at: 10:30am Pick up the phone at the desk and dial [phone] and inform us of your arrival.  Please call this number if you have any problems the morning of surgery: [phone]  Remember: Do not eat food after midnight: Thursday Do not drink clear liquids after: 8am Friday Take these medicines the morning of surgery with a SIP OF WATER: pepcid  Do not wear jewelry, make-up, or FINGER nail polish Do not wear lotions, powders, perfumes or deodorant. Do not shave 48 hours prior to surgery. Do not bring valuables to the hospital. Contacts, dentures or bridgework may not be worn into surgery.  Leave suitcase in the car. After Surgery it may be brought to your room. For patients being admitted to the hospital, checkout time is 11:00am the day of discharge.  Patients discharged on the day of surgery will not be allowed to drive home.     Remember to use your hibiclens as instructed.Please shower with 1/2 bottle the evening before your surgery and the other 1/2 bottle the morning of surgery. Neck down avoiding private area.

## 2011-11-07 ENCOUNTER — Encounter (HOSPITAL_COMMUNITY): Payer: Self-pay

## 2011-11-07 ENCOUNTER — Inpatient Hospital Stay (HOSPITAL_COMMUNITY)
Admission: AD | Admit: 2011-11-07 | Discharge: 2011-11-07 | Disposition: A | Discharge status: Home / Self Care | Payer: Medicaid Other | Source: Ambulatory Visit | Attending: Obstetrics & Gynecology | Admitting: Obstetrics & Gynecology

## 2011-11-07 DIAGNOSIS — R12 Heartburn: Secondary | ICD-10-CM

## 2011-11-07 DIAGNOSIS — R1013 Epigastric pain: Secondary | ICD-10-CM | POA: Insufficient documentation

## 2011-11-07 DIAGNOSIS — O26899 Other specified pregnancy related conditions, unspecified trimester: Secondary | ICD-10-CM

## 2011-11-07 DIAGNOSIS — O479 False labor, unspecified: Secondary | ICD-10-CM

## 2011-11-07 LAB — URINALYSIS, ROUTINE W REFLEX MICROSCOPIC
Bilirubin Urine: NEGATIVE
Glucose, UA: NEGATIVE mg/dL
Ketones, ur: 40 mg/dL — AB
Nitrite: NEGATIVE
Specific Gravity, Urine: 1.015 (ref 1.005–1.030)
pH: 6.5 (ref 5.0–8.0)

## 2011-11-07 LAB — URINE MICROSCOPIC-ADD ON

## 2011-11-07 MED ORDER — OMEPRAZOLE 40 MG PO CPDR: 40.0000 mg | DELAYED_RELEASE_CAPSULE | Freq: Every day | ORAL | Status: DC

## 2011-11-07 MED ORDER — ACETAMINOPHEN 325 MG PO TABS
650.0000 mg | ORAL_TABLET | Freq: Once | ORAL | Status: AC
  Administered 2011-11-07: 650 mg via ORAL
  Filled 2011-11-07: qty 2

## 2011-11-07 MED ORDER — PROMETHAZINE HCL 25 MG/ML IJ SOLN
25.0000 mg | Freq: Once | INTRAMUSCULAR | Status: AC
  Administered 2011-11-07: 25 mg via INTRAMUSCULAR
  Filled 2011-11-07: qty 1

## 2011-11-07 MED ORDER — GI COCKTAIL ~~LOC~~
30.0000 mL | Freq: Once | ORAL | Status: AC
  Administered 2011-11-07: 30 mL via ORAL
  Filled 2011-11-07: qty 30

## 2011-11-07 NOTE — MAU Provider Note (Signed)
History     CSN: [id number]  Arrival date and time: 11/07/11 2011   First Provider Initiated Contact with Patient 11/07/11 2108      No chief complaint on file.  HPI This is a 36 year old G6 P3 0-3 at 38 weeks and 0 days who is seen in the MAU with 2 week history of severe epigastric burning pain that is worse over the past 2 days. Rates the pain 7 to 8/10. It is worse in the evening. No palliating factors. She has tried famotidine and Tums with no success. She is also had vomiting which she believes is due to her epigastric pain. She admits to good fetal activity, no vaginal discharge, vaginal bleeding.  OB History    Grav Para Term Preterm Abortions TAB SAB Ect Mult Living   6 3 3  0 2 2 0 0 0 3      Past Medical History  Diagnosis Date  . Pregnant state, incidental   . GERD (gastroesophageal reflux disease)     with preg  . Degenerative disc disease     Past Surgical History  Procedure Date  . Cholecystectomy   . C sections   . Cesarean section   . Dilation and curettage of uterus     Family History  Problem Relation Age of Onset  . Anesthesia problems Neg Hx     History  Substance Use Topics  . Smoking status: Never Smoker   . Smokeless tobacco: Never Used  . Alcohol Use: No    Allergies: No Known Allergies  Prescriptions prior to admission  Medication Sig Dispense Refill  . acetaminophen (TYLENOL) 500 MG tablet Take 1,000 mg by mouth every 6 (six) hours as needed. pain      . calcium carbonate (TUMS - DOSED IN MG ELEMENTAL CALCIUM) 500 MG chewable tablet Chew 2 tablets by mouth 4 (four) times daily as needed. For heartburn      . famotidine (PEPCID) 20 MG tablet Take 20 mg by mouth every morning.      . ondansetron (ZOFRAN) 4 MG tablet Take 4 mg by mouth every 8 (eight) hours as needed. For nausea        Review of Systems  Constitutional: Negative for fever, chills and weight loss.  Gastrointestinal: Negative for diarrhea, constipation and blood in  stool.  Genitourinary: Negative for dysuria and urgency.  Musculoskeletal: Positive for back pain.  Neurological: Negative for weakness.   Physical Exam   Blood pressure 122/78, pulse 98, temperature 97.3 F (36.3 C), temperature source Oral, resp. rate 18, last menstrual period 02/14/2011, SpO2 100.00%.  Physical Exam  Constitutional: She appears well-developed and well-nourished.  Respiratory: Effort normal.  GI: Soft. Bowel sounds are normal. She exhibits no distension and no mass. There is tenderness (epigastric tenderness.). There is no rebound and no guarding.       Murphy's sign negative  Musculoskeletal: Normal range of motion.  Neurological: She is alert.  Skin: Skin is warm and dry.  Psychiatric: She has a normal mood and affect. Her behavior is normal. Judgment and thought content normal.   Dilation: Closed Effacement (%): Thick Station: -3 Exam by:: J Leily Capek DO   MAU Course  Procedures NST - category 1 tracing  MDM Patient given GI cocktail and Phenergan IM with good results reduction of epigastric pain.  No cervical dilation - patient not laboring.  Will send patient home.  Assessment and Plan  1.  Heartburn Will add omeprazole.  Continue famotidine.  Follow  up 09/13/11 with [facility]. 2.  Braxton Hicks contractions.   Warning signs of labor given.  Patient to return if contractions worsen or become regular.  Shataria Crist JEHIEL 11/07/2011, 9:10 PM

## 2011-11-07 NOTE — Discharge Instructions (Signed)
Abdominal Pain During Pregnancy °Belly (abdominal) pain is common during pregnancy. Most of the time, it is not a serious problem. Other times, it can be a sign that something is wrong with the pregnancy. Always tell your doctor if you have belly pain. °HOME CARE °For mild pain: °· Do not have sex (intercourse) or put anything in your vagina until you feel better.  °· Rest until your pain stops. If your pain lasts longer than 1 hour, call your doctor.  °· Drink clear fluids if you feel sick to your stomach (nauseous).  °· Do not eat solid food until you feel better.  °· Only take medicine as told by your doctor.  °· Keep all doctor visits as told.  °GET HELP RIGHT AWAY IF:  °· You are bleeding, leaking fluid, or pieces of tissue come out of your vagina.  °· You have more pain or cramping.  °· You keep throwing up (vomiting).  °· You have pain when you pee (urinate) or have blood in your pee.  °· You have a fever.  °· You do not feel your baby moving as much.  °· You feel very weak or feel like passing out.  °· You have trouble breathing, with or without belly pain.  °· You have a very bad headache and belly pain.  °· You have fluid leaking from your vagina and belly pain.  °· You keep having watery poop (diarrhea).  °· Your belly pain does not go away after resting, or the pain gets worse.  °MAKE SURE YOU:  °· Understand these instructions.  °· Will watch your condition.  °· Will get help right away if you are not doing well or get worse.  °Document Released: 06/18/2009 Document Revised: 06/19/2011 Document Reviewed: 01/24/2011 °[employer]® Patient Information ©2012 [employer]. °

## 2011-11-07 NOTE — MAU Note (Signed)
Pt states nausea/vomiting started 1100 this morning. Hasn't been able to keep food down and has had heartburn all day. Has taken pepcid, phenergan & zofran and none have helped. Has stabbing pain in mid epigastric area that spreads to her back. Denies vaginal bleeding, leaking of fluid or contractions.

## 2011-11-13 ENCOUNTER — Encounter (HOSPITAL_COMMUNITY): Payer: Self-pay | Admitting: Obstetrics and Gynecology

## 2011-11-13 DIAGNOSIS — Z302 Encounter for sterilization: Secondary | ICD-10-CM

## 2011-11-13 NOTE — H&P (Signed)
[name] is a 36 y.o. female presenting for repeat cesarean section and tubal ligation she has been followed for pregnancy at [facility] with an uneventful prenatal course. The lower uterine segment was described as extremely thin at second trimester survey. History OB History    Grav Para Term Preterm Abortions TAB SAB Ect Mult Living   6 3 3  0 2 2 0 0 0 3     Past Medical History  Diagnosis Date  . Pregnant state, incidental   . GERD (gastroesophageal reflux disease)     with preg  . Degenerative disc disease    Past Surgical History  Procedure Date  . Cholecystectomy   . C sections   . Cesarean section   . Dilation and curettage of uterus    Family History: family history is negative for Anesthesia problems. Social History:  reports that she has never smoked. She has never used smokeless tobacco. She reports that she does not drink alcohol or use illicit drugs.  Review of Systems  Respiratory: Negative.   Cardiovascular: Negative.   All other systems reviewed and are negative.      Last menstrual period 02/14/2011. Exam height 5 feet 8 inches weight 180.6 pounds blood pressure 118/70 Physical Exam  Constitutional: She is oriented to person, place, and time. She appears well-developed and well-nourished.  HENT:  Head: Normocephalic and atraumatic.  Eyes: Pupils are equal, round, and reactive to light.  Neck: Normal range of motion.  Cardiovascular: Normal rate and regular rhythm.   Respiratory: Effort normal and breath sounds normal.  GI: Soft.       Gravid uterus 36 cm fundal height  Neurological: She is alert and oriented to person, place, and time.  Psychiatric: She has a normal mood and affect. Her behavior is normal. Thought content normal.    Prenatal labs: ABO, Rh: --/--/O POS (09/25 2159) Antibody: Negative (10/01 0000) Rubella: Nonimmune (10/01 1420) RPR: NON REACTIVE (04/25 1525)  HBsAg: Negative (10/01 1420)  HIV: Non-reactive (10/01  1420)  GBS:     Assessment/Plan: Pregnancy [redacted] weeks gestation prior cesarean section x3 with known thin lower uterine segment desire for elective permanent sterilization Plan: Repeat cesarean section and bilateral tubal ligation 11/14/2011 at 2 PM   Crystin Lechtenberg V 11/13/2011, 6:22 PM

## 2011-11-14 ENCOUNTER — Encounter (HOSPITAL_COMMUNITY): Payer: Self-pay

## 2011-11-14 ENCOUNTER — Encounter (HOSPITAL_COMMUNITY)
Admission: RE | Disposition: A | Discharge status: Home / Self Care | Payer: Self-pay | Source: Ambulatory Visit | Attending: Obstetrics and Gynecology

## 2011-11-14 ENCOUNTER — Inpatient Hospital Stay (HOSPITAL_COMMUNITY): Payer: Medicaid Other

## 2011-11-14 ENCOUNTER — Inpatient Hospital Stay (HOSPITAL_COMMUNITY)
Admission: RE | Admit: 2011-11-14 | Discharge: 2011-11-17 | DRG: 766 | Disposition: A | Discharge status: Home / Self Care | Payer: Medicaid Other | Source: Ambulatory Visit | Attending: Obstetrics and Gynecology | Admitting: Obstetrics and Gynecology

## 2011-11-14 ENCOUNTER — Encounter (HOSPITAL_COMMUNITY): Payer: Self-pay | Admitting: *Deleted

## 2011-11-14 ENCOUNTER — Encounter (HOSPITAL_COMMUNITY): Payer: Self-pay | Admitting: Anesthesiology

## 2011-11-14 DIAGNOSIS — O09529 Supervision of elderly multigravida, unspecified trimester: Secondary | ICD-10-CM | POA: Diagnosis present

## 2011-11-14 DIAGNOSIS — Z302 Encounter for sterilization: Secondary | ICD-10-CM

## 2011-11-14 DIAGNOSIS — N736 Female pelvic peritoneal adhesions (postinfective): Secondary | ICD-10-CM | POA: Diagnosis present

## 2011-11-14 DIAGNOSIS — O34219 Maternal care for unspecified type scar from previous cesarean delivery: Principal | ICD-10-CM | POA: Diagnosis present

## 2011-11-14 LAB — TYPE AND SCREEN: ABO/RH(D): O POS

## 2011-11-14 LAB — ABO/RH: ABO/RH(D): O POS

## 2011-11-14 SURGERY — Surgical Case
Anesthesia: Spinal | Site: Abdomen | Laterality: Bilateral | Wound class: Clean Contaminated

## 2011-11-14 MED ORDER — LANOLIN HYDROUS EX OINT: 1.0000 "application " | TOPICAL_OINTMENT | CUTANEOUS | Status: DC | PRN

## 2011-11-14 MED ORDER — DIPHENHYDRAMINE HCL 25 MG PO CAPS: 25.0000 mg | ORAL_CAPSULE | ORAL | Status: DC | PRN

## 2011-11-14 MED ORDER — BUPIVACAINE IN DEXTROSE 0.75-8.25 % IT SOLN
INTRATHECAL | Status: DC | PRN
  Administered 2011-11-14: 1.8 mL via INTRATHECAL

## 2011-11-14 MED ORDER — DIPHENHYDRAMINE HCL 50 MG/ML IJ SOLN
12.5000 mg | INTRAMUSCULAR | Status: DC | PRN
  Administered 2011-11-15: 12.5 mg via INTRAVENOUS
  Filled 2011-11-14: qty 1

## 2011-11-14 MED ORDER — KETOROLAC TROMETHAMINE 60 MG/2ML IM SOLN
INTRAMUSCULAR | Status: AC
  Administered 2011-11-14: 60 mg via INTRAMUSCULAR
  Filled 2011-11-14: qty 2

## 2011-11-14 MED ORDER — DIPHENHYDRAMINE HCL 25 MG PO CAPS: 25.0000 mg | ORAL_CAPSULE | Freq: Four times a day (QID) | ORAL | Status: DC | PRN

## 2011-11-14 MED ORDER — MORPHINE SULFATE (PF) 0.5 MG/ML IJ SOLN
INTRAMUSCULAR | Status: DC | PRN
  Administered 2011-11-14: .15 mg via INTRATHECAL

## 2011-11-14 MED ORDER — OXYTOCIN 10 UNIT/ML IJ SOLN
INTRAMUSCULAR | Status: DC | PRN
  Administered 2011-11-14 (×2): 20 [IU] via INTRAMUSCULAR

## 2011-11-14 MED ORDER — OXYTOCIN 20 UNITS IN LACTATED RINGERS INFUSION - SIMPLE: 125.0000 mL/h | INTRAVENOUS | Status: AC

## 2011-11-14 MED ORDER — CEFAZOLIN SODIUM 1-5 GM-% IV SOLN
INTRAVENOUS | Status: AC
  Filled 2011-11-14: qty 100

## 2011-11-14 MED ORDER — LACTATED RINGERS IV SOLN
INTRAVENOUS | Status: DC
  Administered 2011-11-15: via INTRAVENOUS

## 2011-11-14 MED ORDER — DIBUCAINE 1 % RE OINT: 1.0000 "application " | TOPICAL_OINTMENT | RECTAL | Status: DC | PRN

## 2011-11-14 MED ORDER — NALBUPHINE SYRINGE 5 MG/0.5 ML
5.0000 mg | INJECTION | INTRAMUSCULAR | Status: DC | PRN
  Filled 2011-11-14: qty 1

## 2011-11-14 MED ORDER — WITCH HAZEL-GLYCERIN EX PADS: 1.0000 "application " | MEDICATED_PAD | CUTANEOUS | Status: DC | PRN

## 2011-11-14 MED ORDER — MORPHINE SULFATE 0.5 MG/ML IJ SOLN
INTRAMUSCULAR | Status: AC
  Filled 2011-11-14: qty 10

## 2011-11-14 MED ORDER — EPHEDRINE SULFATE 50 MG/ML IJ SOLN
INTRAMUSCULAR | Status: DC | PRN
  Administered 2011-11-14 (×3): 10 mg via INTRAVENOUS

## 2011-11-14 MED ORDER — FENTANYL CITRATE 0.05 MG/ML IJ SOLN
INTRAMUSCULAR | Status: DC | PRN
  Administered 2011-11-14: 25 ug via INTRATHECAL

## 2011-11-14 MED ORDER — OXYCODONE-ACETAMINOPHEN 5-325 MG PO TABS
1.0000 | ORAL_TABLET | ORAL | Status: DC | PRN
  Administered 2011-11-15 – 2011-11-16 (×3): 1 via ORAL
  Filled 2011-11-14 (×3): qty 1

## 2011-11-14 MED ORDER — KETOROLAC TROMETHAMINE 30 MG/ML IJ SOLN: 30.0000 mg | Freq: Four times a day (QID) | INTRAMUSCULAR | Status: AC | PRN

## 2011-11-14 MED ORDER — IBUPROFEN 600 MG PO TABS
600.0000 mg | ORAL_TABLET | Freq: Four times a day (QID) | ORAL | Status: DC
  Administered 2011-11-15 – 2011-11-17 (×10): 600 mg via ORAL
  Filled 2011-11-14 (×10): qty 1

## 2011-11-14 MED ORDER — NALBUPHINE SYRINGE 5 MG/0.5 ML
5.0000 mg | INJECTION | INTRAMUSCULAR | Status: DC | PRN
  Administered 2011-11-14: 10 mg via SUBCUTANEOUS
  Filled 2011-11-14: qty 1

## 2011-11-14 MED ORDER — ONDANSETRON HCL 4 MG/2ML IJ SOLN
INTRAMUSCULAR | Status: AC
  Filled 2011-11-14: qty 2

## 2011-11-14 MED ORDER — METOCLOPRAMIDE HCL 5 MG/ML IJ SOLN: 10.0000 mg | Freq: Three times a day (TID) | INTRAMUSCULAR | Status: DC | PRN

## 2011-11-14 MED ORDER — PHENYLEPHRINE HCL 10 MG/ML IJ SOLN
INTRAMUSCULAR | Status: DC | PRN
  Administered 2011-11-14: 40 ug via INTRAVENOUS
  Administered 2011-11-14 (×2): 80 ug via INTRAVENOUS

## 2011-11-14 MED ORDER — ONDANSETRON HCL 4 MG/2ML IJ SOLN: 4.0000 mg | INTRAMUSCULAR | Status: DC | PRN

## 2011-11-14 MED ORDER — DIPHENHYDRAMINE HCL 50 MG/ML IJ SOLN: 25.0000 mg | INTRAMUSCULAR | Status: DC | PRN

## 2011-11-14 MED ORDER — NALOXONE HCL 0.4 MG/ML IJ SOLN: 0.4000 mg | INTRAMUSCULAR | Status: DC | PRN

## 2011-11-14 MED ORDER — MENTHOL 3 MG MT LOZG: 1.0000 | LOZENGE | OROMUCOSAL | Status: DC | PRN

## 2011-11-14 MED ORDER — TETANUS-DIPHTH-ACELL PERTUSSIS 5-2.5-18.5 LF-MCG/0.5 IM SUSP
0.5000 mL | Freq: Once | INTRAMUSCULAR | Status: AC
  Administered 2011-11-15: 0.5 mL via INTRAMUSCULAR
  Filled 2011-11-14: qty 0.5

## 2011-11-14 MED ORDER — NALBUPHINE SYRINGE 5 MG/0.5 ML
INJECTION | INTRAMUSCULAR | Status: AC
  Administered 2011-11-14: 10 mg via SUBCUTANEOUS
  Filled 2011-11-14: qty 1

## 2011-11-14 MED ORDER — SCOPOLAMINE 1 MG/3DAYS TD PT72
MEDICATED_PATCH | TRANSDERMAL | Status: AC
  Administered 2011-11-14: 1.5 mg via TRANSDERMAL
  Filled 2011-11-14: qty 1

## 2011-11-14 MED ORDER — SODIUM CHLORIDE 0.9 % IJ SOLN: 3.0000 mL | INTRAMUSCULAR | Status: DC | PRN

## 2011-11-14 MED ORDER — CEFAZOLIN SODIUM 1-5 GM-% IV SOLN
INTRAVENOUS | Status: DC | PRN
  Administered 2011-11-14: 2 g via INTRAVENOUS

## 2011-11-14 MED ORDER — 0.9 % SODIUM CHLORIDE (POUR BTL) OPTIME
TOPICAL | Status: DC | PRN
  Administered 2011-11-14: 1000 mL

## 2011-11-14 MED ORDER — ONDANSETRON HCL 4 MG PO TABS: 4.0000 mg | ORAL_TABLET | ORAL | Status: DC | PRN

## 2011-11-14 MED ORDER — PRENATAL MULTIVITAMIN CH
1.0000 | ORAL_TABLET | Freq: Every day | ORAL | Status: DC
  Administered 2011-11-15 – 2011-11-16 (×2): 1 via ORAL
  Filled 2011-11-14 (×2): qty 1

## 2011-11-14 MED ORDER — MEPERIDINE HCL 25 MG/ML IJ SOLN: 6.2500 mg | INTRAMUSCULAR | Status: DC | PRN

## 2011-11-14 MED ORDER — ONDANSETRON HCL 4 MG/2ML IJ SOLN
INTRAMUSCULAR | Status: DC | PRN
  Administered 2011-11-14: 4 mg via INTRAVENOUS

## 2011-11-14 MED ORDER — HYDROMORPHONE HCL PF 1 MG/ML IJ SOLN: 0.2500 mg | INTRAMUSCULAR | Status: DC | PRN

## 2011-11-14 MED ORDER — SCOPOLAMINE 1 MG/3DAYS TD PT72
1.0000 | MEDICATED_PATCH | Freq: Once | TRANSDERMAL | Status: DC
  Administered 2011-11-14: 1.5 mg via TRANSDERMAL

## 2011-11-14 MED ORDER — SCOPOLAMINE 1 MG/3DAYS TD PT72: 1.0000 | MEDICATED_PATCH | Freq: Once | TRANSDERMAL | Status: DC

## 2011-11-14 MED ORDER — SODIUM CHLORIDE 0.9 % IV SOLN: 1.0000 ug/kg/h | INTRAVENOUS | Status: DC | PRN

## 2011-11-14 MED ORDER — FENTANYL CITRATE 0.05 MG/ML IJ SOLN
INTRAMUSCULAR | Status: AC
  Filled 2011-11-14: qty 2

## 2011-11-14 MED ORDER — OXYTOCIN 10 UNIT/ML IJ SOLN
INTRAMUSCULAR | Status: AC
  Filled 2011-11-14: qty 2

## 2011-11-14 MED ORDER — OXYTOCIN 10 UNIT/ML IJ SOLN
INTRAMUSCULAR | Status: AC
Start: 2011-11-14 — End: 2011-11-14
  Filled 2011-11-14: qty 2

## 2011-11-14 MED ORDER — HEMOSTATIC AGENTS (NO CHARGE) OPTIME
TOPICAL | Status: DC | PRN
  Administered 2011-11-14: 1 via TOPICAL

## 2011-11-14 MED ORDER — EPHEDRINE 5 MG/ML INJ
INTRAVENOUS | Status: AC
  Filled 2011-11-14: qty 10

## 2011-11-14 MED ORDER — LACTATED RINGERS IV SOLN
INTRAVENOUS | Status: DC
  Administered 2011-11-14: 16:00:00 via INTRAVENOUS
  Administered 2011-11-14: 125 mL/h via INTRAVENOUS
  Administered 2011-11-14: 12:00:00 via INTRAVENOUS

## 2011-11-14 MED ORDER — SENNOSIDES-DOCUSATE SODIUM 8.6-50 MG PO TABS
2.0000 | ORAL_TABLET | Freq: Every day | ORAL | Status: DC
  Administered 2011-11-14 – 2011-11-16 (×3): 2 via ORAL

## 2011-11-14 MED ORDER — SIMETHICONE 80 MG PO CHEW: 80.0000 mg | CHEWABLE_TABLET | ORAL | Status: DC | PRN

## 2011-11-14 MED ORDER — PHENYLEPHRINE 40 MCG/ML (10ML) SYRINGE FOR IV PUSH (FOR BLOOD PRESSURE SUPPORT)
PREFILLED_SYRINGE | INTRAVENOUS | Status: AC
  Filled 2011-11-14: qty 5

## 2011-11-14 MED ORDER — ONDANSETRON HCL 4 MG/2ML IJ SOLN: 4.0000 mg | Freq: Three times a day (TID) | INTRAMUSCULAR | Status: DC | PRN

## 2011-11-14 MED ORDER — KETOROLAC TROMETHAMINE 60 MG/2ML IM SOLN
60.0000 mg | Freq: Once | INTRAMUSCULAR | Status: AC | PRN
  Administered 2011-11-14: 60 mg via INTRAMUSCULAR

## 2011-11-14 MED ORDER — ZOLPIDEM TARTRATE 5 MG PO TABS: 5.0000 mg | ORAL_TABLET | Freq: Every evening | ORAL | Status: DC | PRN

## 2011-11-14 MED ORDER — IBUPROFEN 600 MG PO TABS: 600.0000 mg | ORAL_TABLET | Freq: Four times a day (QID) | ORAL | Status: DC | PRN

## 2011-11-14 MED ORDER — SIMETHICONE 80 MG PO CHEW
80.0000 mg | CHEWABLE_TABLET | Freq: Three times a day (TID) | ORAL | Status: DC
  Administered 2011-11-14 – 2011-11-17 (×10): 80 mg via ORAL

## 2011-11-14 SURGICAL SUPPLY — 31 items
BENZOIN TINCTURE PRP APPL 2/3 (GAUZE/BANDAGES/DRESSINGS) ×2 IMPLANT
CLIP FILSHIE TUBAL LIGA STRL (Clip) ×2 IMPLANT
CLOTH BEACON ORANGE TIMEOUT ST (SAFETY) ×2 IMPLANT
DRSG COVADERM 4X10 (GAUZE/BANDAGES/DRESSINGS) ×2 IMPLANT
ELECT REM PT RETURN 9FT ADLT (ELECTROSURGICAL) ×2
ELECTRODE REM PT RTRN 9FT ADLT (ELECTROSURGICAL) ×1 IMPLANT
EXTRACTOR VACUUM KIWI (MISCELLANEOUS) IMPLANT
GLOVE BIO SURGEON ST LM GN SZ9 (GLOVE) ×2 IMPLANT
GLOVE BIOGEL PI IND STRL 9 (GLOVE) ×2 IMPLANT
GLOVE BIOGEL PI INDICATOR 9 (GLOVE) ×2
GOWN PREVENTION PLUS LG XLONG (DISPOSABLE) ×2 IMPLANT
GOWN PREVENTION PLUS XXLARGE (GOWN DISPOSABLE) ×2 IMPLANT
GOWN STRL REIN 3XL LVL4 (GOWN DISPOSABLE) ×2 IMPLANT
HEMOSTAT SURGICEL 2X14 (HEMOSTASIS) ×2 IMPLANT
NEEDLE HYPO 25X5/8 SAFETYGLIDE (NEEDLE) IMPLANT
NS IRRIG 1000ML POUR BTL (IV SOLUTION) ×2 IMPLANT
PACK C SECTION WH (CUSTOM PROCEDURE TRAY) ×2 IMPLANT
RETRACTOR WND ALEXIS 25 LRG (MISCELLANEOUS) IMPLANT
RTRCTR C-SECT PINK 25CM LRG (MISCELLANEOUS) IMPLANT
RTRCTR WOUND ALEXIS 25CM LRG (MISCELLANEOUS)
SLEEVE SCD COMPRESS KNEE MED (MISCELLANEOUS) IMPLANT
STRIP CLOSURE SKIN 1/2X4 (GAUZE/BANDAGES/DRESSINGS) ×2 IMPLANT
SUT CHROMIC 0 CTX 36 (SUTURE) ×4 IMPLANT
SUT VIC AB 0 CT1 27 (SUTURE) ×1
SUT VIC AB 0 CT1 27XBRD ANBCTR (SUTURE) ×1 IMPLANT
SUT VIC AB 2-0 CT1 27 (SUTURE) ×3
SUT VIC AB 2-0 CT1 TAPERPNT 27 (SUTURE) ×3 IMPLANT
SUT VIC AB 4-0 KS 27 (SUTURE) ×2 IMPLANT
SYR BULB IRRIGATION 50ML (SYRINGE) ×2 IMPLANT
TRAY FOLEY CATH 14FR (SET/KITS/TRAYS/PACK) ×2 IMPLANT
WATER STERILE IRR 1000ML POUR (IV SOLUTION) ×2 IMPLANT

## 2011-11-14 NOTE — Brief Op Note (Signed)
11/14/2011  3:44 PM  PATIENT:  [name]  36 y.o. female  PRE-OPERATIVE DIAGNOSIS:  previous cesarean section; desires sterilization  POST-OPERATIVE DIAGNOSIS:  previous cesarean section; desires sterilization  PROCEDURE:  Procedure(s) (LRB): CESAREAN SECTION WITH BILATERAL TUBAL LIGATION (Bilateral)  SURGEON:  Surgeon(s) and Role:    * [name], MD - Primary  PHYSICIAN ASSISTANT:   ASSISTANTS: none   ANESTHESIA:   spinal  EBL:  Total I/O In: 2700 [I.V.:2700] Out: 900 [Urine:200; Blood:700]  BLOOD ADMINISTERED:none  DRAINS: Urinary Catheter (Foley)   LOCAL MEDICATIONS USED:  NONE  SPECIMEN:  Source of Specimen:  placenta  DISPOSITION OF SPECIMEN:  labor and delivery  COUNTS:  YES  TOURNIQUET:  * No tourniquets in log *  DICTATION: .Dragon Dictation Patient was taken operating room prepped and draped for lower surgery along the prior cesarean section scar. Timeout was conducted by operative team. The patient received Ancef 2 g IV prior to procedure. Pfannenstiel type incision was repeated with excision of the old cicatrix and 5 mm of skin only the side and the fascia open with minimal difficulty. The split in the midline, but we were unable to identify the peritoneal cavity. Particular care was and done to stay out of the bladder cut and the extraperitoneal cesarean was performed the bladder to be retracted inferiorly and the lower uterine segment identified a transverse incision was made well above the bladder, 1 cm, and the fetal vertex rotated into the incision to the fibrous tissues made it difficult to extract the baby manually so the baby was delivered using a Kiwi vacuum extractor. The baby was in the posterior position was rotated slightly sufficiently to place a Kiwi properly on the occiput and then the baby was delivered by vacuum extraction combined with vigorous fundal pressure. One pop-off occurred before we were able to be successful. Fundal pressure  was the primary delivery force. The baby was passed to the waiting pediatrician see their notes for details. Cord blood samples were obtained and the placenta delivered by coud massage. Uterus was irrigated, closed with a running locking 0 chromic gut at each lateral angle and sewing to the midline. No second layer was necessary. Hemostasis was surprisingly good given the extensive nature of the adhesions encountered upon finding the uterus.  Tubal ligation: At this time we not yet entered the peritoneal cavity due to the extensive nature of adhesions from the lower uterine segment the to the anterior abdominal wall. We were able to identify a window in the right side of the midline where thin adhesions existed. A window was opened and we were able to trace out to the right round ligament and fallopian tube and with with full visibility of the tube to its fimbriated end, and a Filshie clip was applied to the midsegment of the tube. There was a dense adhesion in the midline which did not allow access to the left tube until the dense midline adhesion was taken down. At this time the adhesions, which extended up to the level of left round ligament were sufficiently freed up we could find the tube on the left and then again Filshie clip was applied without difficulty. The mobilized serosal surface to the to the uterus was then tacked back down to the denuded anterior lower uterine segment of the uterus and away to reduce potential for future adhesions, hopefully. Marland Kitchen Anterior peritoneum could be pulled together on either side to reduce raw surfaces. A small piece of Surgicel was  necessary just the C-section uterine hysterotomy incision for hemostasis. Fascia was closed using 0 Vicryl continuous running, then subcutaneous re approximation, using 2-0 Vicryl, then subcuticular 4-0 Vicryl skin incision closure with Mellody Dance needle. Estimated blood loss 500 cc sponge and needle counts correct patient to recovery in stable  condition.  PLAN OF CARE: Admit to inpatient   PATIENT DISPOSITION:  PACU - hemodynamically stable.   Delay start of Pharmacological VTE agent (>24hrs) due to surgical blood loss or risk of bleeding: not applicable

## 2011-11-14 NOTE — Anesthesia Preprocedure Evaluation (Signed)

## 2011-11-14 NOTE — Anesthesia Postprocedure Evaluation (Signed)
  Anesthesia Post-op Note  Patient: [name]  Procedure(s) Performed: Procedure(s) (LRB): CESAREAN SECTION WITH BILATERAL TUBAL LIGATION (Bilateral)  Patient Location: PACU  Anesthesia Type: Spinal  Level of Consciousness: awake, alert  and oriented  Airway and Oxygen Therapy: Patient Spontanous Breathing  Post-op Pain: none  Post-op Assessment: Post-op Vital signs reviewed, Patient's Cardiovascular Status Stable, Respiratory Function Stable, Patent Airway, No signs of Nausea or vomiting and Pain level controlled  Post-op Vital Signs: Reviewed and stable  Complications: No apparent anesthesia complications

## 2011-11-14 NOTE — Consult Note (Addendum)
Asked by Dr Emelda Fear to attend delivery of this infant by repeat C/S at 39 weeks. Prenatal labs are neg with undocumented GBS. ROM at delivery.  Infant was vigorous and did not need resuscitation. Apgars 9/9. Wrapped for skin to skin. Care to Dr Erik Obey.

## 2011-11-14 NOTE — Op Note (Signed)
See operative details included in the brief operative note 

## 2011-11-14 NOTE — Interval H&P Note (Signed)
History and Physical Interval Note:  11/14/2011 2:01 PM  [name]  has presented today for surgery, with the diagnosis of previous cesarean section; desires sterilization  The various methods of treatment have been discussed with the patient and family. After consideration of risks, benefits and other options for treatment, the patient has consented to  Procedure(s) (LRB): CESAREAN SECTION WITH BILATERAL TUBAL LIGATION (Bilateral) as a surgical intervention .  The patients' history has been reviewed, patient examined, no change in status, stable for surgery.  I have reviewed the patients' chart and labs.  Questions were answered to the patient's satisfaction.     [name]

## 2011-11-14 NOTE — Anesthesia Procedure Notes (Addendum)
Spinal  Start time: 11/14/2011 2:12 PM Staffing Anesthesiologist: Aerianna Losey A. Performed by: anesthesiologist  Preanesthetic Checklist Completed: patient identified, site marked, surgical consent, pre-op evaluation, timeout performed, IV checked, risks and benefits discussed and monitors and equipment checked Spinal Block Patient position: sitting Prep: site prepped and draped and DuraPrep Patient monitoring: heart rate, continuous pulse ox, blood pressure and cardiac monitor Approach: midline Location: L4-5 Injection technique: single-shot Needle Needle type: Sprotte  Needle gauge: 24 G Needle length: 10 cm Needle insertion depth: 4 cm Assessment Sensory level: T4 Additional Notes Patient tolerated procedure well. Adequate sensory level.

## 2011-11-14 NOTE — Transfer of Care (Signed)
Immediate Anesthesia Transfer of Care Note  Patient: [name]  Procedure(s) Performed: Procedure(s) (LRB): CESAREAN SECTION WITH BILATERAL TUBAL LIGATION (Bilateral)  Patient Location: PACU  Anesthesia Type: Spinal  Level of Consciousness: awake, alert  and oriented  Airway & Oxygen Therapy: Patient Spontanous Breathing  Post-op Assessment: Report given to PACU RN and Post -op Vital signs reviewed and stable  Post vital signs: Reviewed and stable  Complications: No apparent anesthesia complications

## 2011-11-15 LAB — CBC
Hemoglobin: 8.1 g/dL — ABNORMAL LOW (ref 12.0–15.0)
MCH: 27.8 pg (ref 26.0–34.0)
Platelets: 166 10*3/uL (ref 150–400)
RBC: 2.91 MIL/uL — ABNORMAL LOW (ref 3.87–5.11)
WBC: 9.9 10*3/uL (ref 4.0–10.5)

## 2011-11-15 NOTE — Anesthesia Postprocedure Evaluation (Signed)
  Anesthesia Post-op Note  Patient: [name]  Procedure(s) Performed: Procedure(s) (LRB): CESAREAN SECTION WITH BILATERAL TUBAL LIGATION (Bilateral)  Patient Location: Mother/Baby  Anesthesia Type: Spinal  Level of Consciousness: awake, alert  and oriented  Airway and Oxygen Therapy: Patient Spontanous Breathing  Post-op Pain: mild  Post-op Assessment: Patient's Cardiovascular Status Stable, Respiratory Function Stable, Patent Airway, No signs of Nausea or vomiting and Pain level controlled  Post-op Vital Signs: stable  Complications: No apparent anesthesia complications

## 2011-11-15 NOTE — Addendum Note (Signed)
Addendum  created 11/15/11 1101 by Lincoln Brigham, CRNA   Modules edited:Notes Section

## 2011-11-15 NOTE — Progress Notes (Signed)
Subjective: Postpartum Day 1: Cesarean Delivery Patient reports incisional pain, tolerating PO and no problems voiding.    Objective: Vital signs in last 24 hours: Temp:  [97.2 F (36.2 C)-98.2 F (36.8 C)] 97.3 F (36.3 C) (05/04 0230) Pulse Rate:  [62-120] 65  (05/04 0230) Resp:  [16-20] 18  (05/04 0230) BP: (106-132)/(64-83) 106/67 mmHg (05/04 0230) SpO2:  [97 %-100 %] 99 % (05/04 0230) Weight:  [180 lb (81.647 kg)] 180 lb (81.647 kg) (05/03 1123)  Physical Exam:  General: alert and no distress Lochia: appropriate Uterine Fundus: firm Incision: healing well, no significant drainage DVT Evaluation: No evidence of DVT seen on physical exam.   Basename 11/15/11 0533  HGB 8.1*  HCT 25.2*    Assessment/Plan: Status post Cesarean section. Doing well postoperatively.  Continue current care Anticipate D/C tomorrow or next day.  [facility] 11/15/2011, 6:25 AM

## 2011-11-16 NOTE — Progress Notes (Signed)
Subjective: Postpartum Day 2: Cesarean Delivery Patient reports incisional pain, tolerating PO and no problems voiding.    Objective: Vital signs in last 24 hours: Temp:  [97.8 F (36.6 C)-98.5 F (36.9 C)] 98.5 F (36.9 C) (05/05 0508) Pulse Rate:  [61-67] 62  (05/05 0508) Resp:  [18] 18  (05/05 0508) BP: (106-119)/(64-79) 110/69 mmHg (05/05 0508) SpO2:  [95 %-98 %] 95 % (05/04 1430)  Physical Exam:  General: alert, cooperative and no distress Lochia: appropriate Uterine Fundus: firm Incision: healing well, no significant drainage, no dehiscence, no significant erythema DVT Evaluation: No evidence of DVT seen on physical exam. Negative Homan's sign.   Basename 11/15/11 0533  HGB 8.1*  HCT 25.2*    Assessment/Plan: Status post Cesarean section. Doing well postoperatively.  Continue current care.  Arnita Koons JEHIEL 11/16/2011, 9:11 AM

## 2011-11-17 MED ORDER — MEASLES, MUMPS & RUBELLA VAC ~~LOC~~ INJ
0.5000 mL | INJECTION | Freq: Once | SUBCUTANEOUS | Status: AC
  Administered 2011-11-17: 0.5 mL via SUBCUTANEOUS
  Filled 2011-11-17: qty 0.5

## 2011-11-17 MED ORDER — IBUPROFEN 600 MG PO TABS: 600.0000 mg | ORAL_TABLET | Freq: Four times a day (QID) | ORAL | Status: AC

## 2011-11-17 NOTE — Discharge Summary (Signed)
Obstetric Discharge Summary Reason for Admission: cesarean section Prenatal Procedures: none Intrapartum Procedures: cesarean: low cervical, transverse and tubal ligation Postpartum Procedures: none Complications-Operative and Postpartum: none Hemoglobin  Date Value Range Status  11/15/2011 8.1* 12.0-15.0 (g/dL) Final     HCT  Date Value Range Status  11/15/2011 25.2* 36.0-46.0 (%) Final    Physical Exam:  General: alert, cooperative and no distress Lochia: appropriate Uterine Fundus: firm Incision: healing well, no significant drainage, no dehiscence DVT Evaluation: No evidence of DVT seen on physical exam. Negative Homan's sign.  Discharge Diagnoses: Term Pregnancy-delivered  Discharge Information: Date: 11/17/2011 Activity: pelvic rest Diet: routine Medications: PNV and Ibuprofen Condition: stable Instructions: refer to practice specific booklet Discharge to: home Follow-up Information    Follow up with [facility].         Newborn Data: Live born female  Birth Weight: 7 lb 11.8 oz (3510 g) APGAR: 9, 9  Home with mother.  Sigifredo Pignato JEHIEL 11/17/2011, 9:31 AM

## 2011-11-17 NOTE — Discharge Instructions (Signed)

## 2011-11-17 NOTE — Progress Notes (Signed)
UR chart review completed.  

## 2012-12-25 ENCOUNTER — Emergency Department (HOSPITAL_COMMUNITY)
Admission: EM | Admit: 2012-12-25 | Discharge: 2012-12-25 | Disposition: A | Discharge status: Home / Self Care | Payer: Self-pay | Attending: Emergency Medicine | Admitting: Emergency Medicine

## 2012-12-25 ENCOUNTER — Encounter (HOSPITAL_COMMUNITY): Payer: Self-pay

## 2012-12-25 ENCOUNTER — Emergency Department (HOSPITAL_COMMUNITY): Payer: Self-pay

## 2012-12-25 DIAGNOSIS — S82899A Other fracture of unspecified lower leg, initial encounter for closed fracture: Secondary | ICD-10-CM | POA: Insufficient documentation

## 2012-12-25 DIAGNOSIS — Y9389 Activity, other specified: Secondary | ICD-10-CM | POA: Insufficient documentation

## 2012-12-25 DIAGNOSIS — S82402A Unspecified fracture of shaft of left fibula, initial encounter for closed fracture: Secondary | ICD-10-CM

## 2012-12-25 DIAGNOSIS — Z79899 Other long term (current) drug therapy: Secondary | ICD-10-CM | POA: Insufficient documentation

## 2012-12-25 DIAGNOSIS — Z8739 Personal history of other diseases of the musculoskeletal system and connective tissue: Secondary | ICD-10-CM | POA: Insufficient documentation

## 2012-12-25 DIAGNOSIS — Y929 Unspecified place or not applicable: Secondary | ICD-10-CM | POA: Insufficient documentation

## 2012-12-25 MED ORDER — OXYCODONE-ACETAMINOPHEN 5-325 MG PO TABS: 1.0000 | ORAL_TABLET | ORAL | Status: DC | PRN

## 2012-12-25 MED ORDER — OXYCODONE-ACETAMINOPHEN 5-325 MG PO TABS
1.0000 | ORAL_TABLET | Freq: Once | ORAL | Status: AC
  Administered 2012-12-25: 1 via ORAL
  Filled 2012-12-25: qty 1

## 2012-12-25 MED ORDER — IBUPROFEN 600 MG PO TABS: 600.0000 mg | ORAL_TABLET | Freq: Four times a day (QID) | ORAL | Status: DC | PRN
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
## 2012-12-28 NOTE — ED Provider Notes (Signed)
Medical screening examination/treatment/procedure(s) were performed by non-physician practitioner and as supervising physician I was immediately available for consultation/collaboration.   Joya Gaskins, MD 12/28/12 1515

## 2013-04-15 ENCOUNTER — Other Ambulatory Visit: Payer: Self-pay | Admitting: Obstetrics and Gynecology

## 2013-04-19 ENCOUNTER — Other Ambulatory Visit: Payer: Self-pay | Admitting: Obstetrics and Gynecology

## 2013-08-10 ENCOUNTER — Ambulatory Visit: Payer: Self-pay | Admitting: Obstetrics and Gynecology

## 2013-12-16 ENCOUNTER — Emergency Department (HOSPITAL_COMMUNITY)
Admission: EM | Admit: 2013-12-16 | Discharge: 2013-12-16 | Disposition: A | Discharge status: Home / Self Care | Payer: BC Managed Care – PPO | Attending: Emergency Medicine | Admitting: Emergency Medicine

## 2013-12-16 ENCOUNTER — Emergency Department (HOSPITAL_COMMUNITY): Payer: BC Managed Care – PPO

## 2013-12-16 ENCOUNTER — Encounter (HOSPITAL_COMMUNITY): Payer: Self-pay | Admitting: Emergency Medicine

## 2013-12-16 DIAGNOSIS — Z79899 Other long term (current) drug therapy: Secondary | ICD-10-CM | POA: Insufficient documentation

## 2013-12-16 DIAGNOSIS — Y929 Unspecified place or not applicable: Secondary | ICD-10-CM | POA: Insufficient documentation

## 2013-12-16 DIAGNOSIS — IMO0002 Reserved for concepts with insufficient information to code with codable children: Secondary | ICD-10-CM | POA: Insufficient documentation

## 2013-12-16 DIAGNOSIS — Y9301 Activity, walking, marching and hiking: Secondary | ICD-10-CM | POA: Insufficient documentation

## 2013-12-16 DIAGNOSIS — R296 Repeated falls: Secondary | ICD-10-CM | POA: Insufficient documentation

## 2013-12-16 DIAGNOSIS — S46912A Strain of unspecified muscle, fascia and tendon at shoulder and upper arm level, left arm, initial encounter: Secondary | ICD-10-CM

## 2013-12-16 DIAGNOSIS — M51379 Other intervertebral disc degeneration, lumbosacral region without mention of lumbar back pain or lower extremity pain: Secondary | ICD-10-CM | POA: Insufficient documentation

## 2013-12-16 DIAGNOSIS — M5137 Other intervertebral disc degeneration, lumbosacral region: Secondary | ICD-10-CM | POA: Insufficient documentation

## 2013-12-16 MED ORDER — HYDROCODONE-ACETAMINOPHEN 5-325 MG PO TABS: 2.0000 | ORAL_TABLET | ORAL | Status: DC | PRN

## 2013-12-16 MED ORDER — HYDROCODONE-ACETAMINOPHEN 5-325 MG PO TABS
2.0000 | ORAL_TABLET | Freq: Once | ORAL | Status: AC
  Administered 2013-12-16: 2 via ORAL
  Filled 2013-12-16: qty 2

## 2013-12-16 NOTE — Discharge Instructions (Signed)
Muscle Strain A muscle strain is an injury that occurs when a muscle is stretched beyond its normal length. Usually a small number of muscle fibers are torn when this happens. Muscle strain is rated in degrees. First-degree strains have the least amount of muscle fiber tearing and pain. Second-degree and third-degree strains have increasingly more tearing and pain.  Usually, recovery from muscle strain takes 1 2 weeks. Complete healing takes 5 6 weeks.  CAUSES  Muscle strain happens when a sudden, violent force placed on a muscle stretches it too far. This may occur with lifting, sports, or a fall.  RISK FACTORS Muscle strain is especially common in athletes.  SIGNS AND SYMPTOMS At the site of the muscle strain, there may be:  Pain.  Bruising.  Swelling.  Difficulty using the muscle due to pain or lack of normal function. DIAGNOSIS  Your health care provider will perform a physical exam and ask about your medical history. TREATMENT  Often, the best treatment for a muscle strain is resting, icing, and applying cold compresses to the injured area.  HOME CARE INSTRUCTIONS   Use the PRICE method of treatment to promote muscle healing during the first 2 3 days after your injury. The PRICE method involves:  Protecting the muscle from being injured again.  Restricting your activity and resting the injured body part.  Icing your injury. To do this, put ice in a plastic bag. Place a towel between your skin and the bag. Then, apply the ice and leave it on from 15 20 minutes each hour. After the third day, switch to moist heat packs.  Apply compression to the injured area with a splint or elastic bandage. Be careful not to wrap it too tightly. This may interfere with blood circulation or increase swelling.  Elevate the injured body part above the level of your heart as often as you can.  Only take over-the-counter or prescription medicines for pain, discomfort, or fever as directed by your  health care provider.  Warming up prior to exercise helps to prevent future muscle strains. SEEK MEDICAL CARE IF:   You have increasing pain or swelling in the injured area.  You have numbness, tingling, or a significant loss of strength in the injured area. MAKE SURE YOU:   Understand these instructions.  Will watch your condition.  Will get help right away if you are not doing well or get worse. Document Released: 06/30/2005 Document Revised: 04/20/2013 Document Reviewed: 01/27/2013 [facility] Patient Information 2014 [place], [place]. Sling Use After Injury or Surgery You have been put in a sling today because of an injury or following surgery. If you have a tendon or bone injury it may take up to 6 weeks to heal. Use the sling as directed until your caregiver says it is no longer needed. The sling protects and keeps you from using the injured part. Hanging your arm in a sling will give rest and support to the injured part. This also helps with comfort and healing. Slings are used for injuries made worse or more painful by movement. Examples include:  Broken arms.  Broken collarbones.  Shoulder injuries.  Following surgery. The sling should fit comfortably, with your elbow at one end of the sling and your hand at the other end. Your elbow is bent 90 degrees lying across your waist and rests in the sling with your thumb pointing up. Make sure that the hand of the injured arm does not droop down. That could stretch some nerves in  the wrist. Your hand should be slightly higher than your elbow. You may also pad the sling behind your neck with some cloth or foam rubber.  A swathe may also be used if it is necessary to keep you from lifting your injured arm. A swathe is a wrap or ace bandage that goes around your chest over your injured arm.  To take the weight off your neck, some slings have a strap that goes around your neck and down your back. One strap is connected to the closed elbow side  of the sling with the other end of the strap attached to the wrist side. With a sling like this, your injured shoulder, arm, wrist, or hand is in the sling, the weight is more on your shoulder and back. This is different from the illustration where the sling is supported only by the neck.  In an emergency, a sling can be as simple as a belt or towel tied around your neck to hold your forearm.  HOME CARE INSTRUCTIONS   Do not use your shoulder until instructed to by your caregiver.  If you have been prescribed physical therapy, keep appointments as directed.  For the first couple days following your injury and during times when you are sore, you may use ice on the injured area for 15-20 minutes 03-04 times per day while awake. Put the ice in a plastic bag and place a towel between the bag of ice and your skin. This will help keep the swelling down.  If there is numbness in the fifth finger and ring fingers you may need to pad the elbow to relieve pressure on the ulnar nerve (the crazy bone).  Keep your arm on your chest when lying down.  If a plaster splint was applied, wear the splint until you are seen for a follow-up examination. Rest it on nothing harder than a pillow the first 24 hours. Do not get it wet. You may take it off to take a shower or bath unless instructed otherwise by your caregiver.  You may have been given an elastic bandage to use with the plaster splint or alone. The splint is too tight if you have numbness, tingling, or if your hand becomes cold and blue. Adjust or reapply the bandage to make it comfortable.  Only take over-the-counter or prescription medicines for pain, discomfort, or fever as directed by your caregiver.  If range of motion exercises are permitted by your caregiver, do not go over the limits suggested. If you have increased pain from doing gentle exercises, stop the exercises until you see your caregiver again.  The length of time needed for healing  depends on what your injury or surgery was. SEEK IMMEDIATE MEDICAL CARE IF:   You have an increase in bruising, swelling or pain in the area of your injury or surgery.  You notice a blue color of or coldness in your fingers.  Pain relief is not obtained with medications or any of your problems are getting worse. Document Released: 02/12/2004 Document Revised: 06/16/2012 Document Reviewed: 05/15/2007 [facility] Patient Information 2014 [place].

## 2013-12-16 NOTE — ED Provider Notes (Signed)
CSN: [id number]     Arrival date & time 12/16/13  0228 History   First MD Initiated Contact with Patient 12/16/13 [phone]     Chief Complaint  Patient presents with  . Fall     (Consider location/radiation/quality/duration/timing/severity/associated sxs/prior Treatment) HPI Comments: Patient presents to the ER for evaluation of left shoulder injury. Patient reports that she fell while walking around 11 PM. Patient states that she is hurting in the area near the shoulder blade on the left side. Skin is hairless consciousness. Pain is moderate but worsens with movement of the arm.  Patient is a 38 y.o. female presenting with fall.  Fall Pertinent negatives include no headaches.    Past Medical History  Diagnosis Date  . Degenerative disc disease     L1 and L5 per pt, no meds   Past Surgical History  Procedure Laterality Date  . Cholecystectomy    . C sections    . Cesarean section      x 3  . Dilation and curettage of uterus    . Wisdom tooth extraction     Family History  Problem Relation Age of Onset  . Anesthesia problems Neg Hx    History  Substance Use Topics  . Smoking status: Never Smoker   . Smokeless tobacco: Never Used  . Alcohol Use: No   OB History   Grav Para Term Preterm Abortions TAB SAB Ect Mult Living   6 4 4  0 2 2 0 0 0 4     Review of Systems  Musculoskeletal: Negative for back pain and neck pain.  Neurological: Negative for headaches.  All other systems reviewed and are negative.     Allergies  Review of patient's allergies indicates no known allergies.  Home Medications   Prior to Admission medications   Medication Sig Start Date End Date Taking? Authorizing Provider  escitalopram (LEXAPRO) 20 MG tablet TAKE ONE TABLET BY MOUTH ONCE DAILY. 04/19/13   [name], MD  ibuprofen (ADVIL,MOTRIN) 600 MG tablet Take 1 tablet (600 mg total) by mouth every 6 (six) hours as needed for pain. 12/25/12   Hope Orlene Och, NP  oxyCODONE-acetaminophen  (PERCOCET/ROXICET) 5-325 MG per tablet Take 1 tablet by mouth every 4 (four) hours as needed for pain. 12/25/12   Hope Orlene Och, NP   BP 118/81  Pulse 82  Temp(Src) 98.4 F (36.9 C) (Oral)  Resp 20  Ht 5\' 2"  (1.575 m)  Wt 158 lb (71.668 kg)  BMI 28.89 kg/m2  SpO2 100%  LMP 11/15/2013 Physical Exam  Constitutional: She is oriented to person, place, and time. She appears well-developed and well-nourished. No distress.  HENT:  Head: Normocephalic and atraumatic.  Right Ear: Hearing normal.  Left Ear: Hearing normal.  Nose: Nose normal.  Mouth/Throat: Oropharynx is clear and moist and mucous membranes are normal.  Eyes: Conjunctivae and EOM are normal. Pupils are equal, round, and reactive to light.  Neck: Normal range of motion. Neck supple.  Cardiovascular: Regular rhythm, S1 normal and S2 normal.  Exam reveals no gallop and no friction rub.   No murmur heard. Pulmonary/Chest: Effort normal and breath sounds normal. No respiratory distress. She exhibits no tenderness.  Abdominal: Soft. Normal appearance and bowel sounds are normal. There is no hepatosplenomegaly. There is no tenderness. There is no rebound, no guarding, no tenderness at McBurney's point and negative Murphy's sign. No hernia.  Musculoskeletal: Normal range of motion.       Cervical back: Normal.  Thoracic back: She exhibits tenderness. She exhibits no bony tenderness.       Back:  Neurological: She is alert and oriented to person, place, and time. She has normal strength. No cranial nerve deficit or sensory deficit. Coordination normal. GCS eye subscore is 4. GCS verbal subscore is 5. GCS motor subscore is 6.  Skin: Skin is warm, dry and intact. No rash noted. No cyanosis.  Psychiatric: She has a normal mood and affect. Her speech is normal and behavior is normal. Thought content normal.    ED Course  Procedures (including critical care time) Labs Review Labs Reviewed - No data to display  Imaging Review Dg  Thoracic Spine W/swimmers  12/16/2013   CLINICAL DATA:  Fall  EXAM: THORACIC SPINE - 2 VIEW + SWIMMERS  COMPARISON:  None.  FINDINGS: There is no evidence of thoracic spine fracture. Minimal dextroscoliosis noted within the thoracic spine. No listhesis. No other significant bone abnormalities are identified.  IMPRESSION: No acute traumatic injury within the thoracic spine.   Electronically Signed   By: Rise MuBenjamin  McClintock M.D.   On: 12/16/2013 04:08   Dg Shoulder Left  12/16/2013   CLINICAL DATA:  Fall  EXAM: LEFT SHOULDER - 2+ VIEW  COMPARISON:  None.  FINDINGS: There is no evidence of fracture or dislocation. There is no evidence of arthropathy or other focal bone abnormality. Soft tissues are unremarkable.  IMPRESSION: Negative.   Electronically Signed   By: Rise MuBenjamin  McClintock M.D.   On: 12/16/2013 04:05     EKG Interpretation None      MDM   Final diagnoses:  None  shoulder/back strain   Pain is in the area of the left upper thoracic region after a fall. No evidence of deformity of the left shoulder clavicle. No midline tenderness over the thoracic spine, cervical spine or lumbar spine. No evidence of head injury. No anterior chest wall tenderness or abdominal tenderness. X-ray of left shoulder and thoracic spine was negative.    Gilda Creasehristopher J. Pollina, MD 12/16/13 [phone]

## 2013-12-16 NOTE — ED Notes (Signed)
Pt states she fell approx 11 pm, landed on the ground hitting her left scapular area on the ground

## 2014-03-30 ENCOUNTER — Other Ambulatory Visit: Payer: Self-pay | Admitting: Obstetrics and Gynecology

## 2014-03-31 ENCOUNTER — Telehealth: Payer: Self-pay | Admitting: Obstetrics and Gynecology

## 2014-03-31 ENCOUNTER — Other Ambulatory Visit: Payer: Self-pay | Admitting: *Deleted

## 2014-03-31 ENCOUNTER — Other Ambulatory Visit: Payer: Self-pay | Admitting: Obstetrics and Gynecology

## 2014-03-31 NOTE — Telephone Encounter (Signed)
Pt stated that pharmacy couldn't find the Rx. Upon further review of her chart the Rx had been refilled but it had been printed out instead of e-scribed. I called it into the pharmacy using the order Dr.Ferguson had already put in.

## 2014-03-31 NOTE — Telephone Encounter (Signed)
Pt aware medication refilled and sent to [facility].

## 2014-05-15 ENCOUNTER — Encounter (HOSPITAL_COMMUNITY): Payer: Self-pay | Admitting: Emergency Medicine

## 2014-10-31 ENCOUNTER — Other Ambulatory Visit: Payer: Self-pay | Admitting: Obstetrics and Gynecology

## 2015-01-25 ENCOUNTER — Telehealth: Payer: Self-pay | Admitting: Family Medicine

## 2015-01-25 NOTE — Telephone Encounter (Signed)
Patient had a boil under her arm about 2 weeks ago and went to Urgent Care.  The urgent care gave her an antibiotic and told her that it had turned into MRSA but the antibiotic cleared it up.  The urgent care sent her a letter telling her that she needed to follow up with them because.  She still has a knot under her arm and wanted to know if she should follow up at the urgent care or here?

## 2015-01-25 NOTE — Telephone Encounter (Signed)
[facility]

## 2015-01-25 NOTE — Telephone Encounter (Signed)
It is the pts option but we can follow her up ( may need to be seen Friday depending on the pts point of view)

## 2015-01-29 NOTE — Telephone Encounter (Signed)
Patietn has appt 01/31/15 with carolyn for recheck of abscess and to discuss referral for abscess.

## 2015-01-31 ENCOUNTER — Ambulatory Visit: Payer: Self-pay | Admitting: Nurse Practitioner

## 2015-03-31 ENCOUNTER — Emergency Department (HOSPITAL_COMMUNITY)
Admission: EM | Admit: 2015-03-31 | Discharge: 2015-03-31 | Disposition: A | Discharge status: Home / Self Care | Payer: 59 | Attending: Emergency Medicine | Admitting: Emergency Medicine

## 2015-03-31 ENCOUNTER — Encounter (HOSPITAL_COMMUNITY): Payer: Self-pay | Admitting: Emergency Medicine

## 2015-03-31 DIAGNOSIS — Z79899 Other long term (current) drug therapy: Secondary | ICD-10-CM | POA: Diagnosis not present

## 2015-03-31 DIAGNOSIS — M545 Low back pain: Secondary | ICD-10-CM | POA: Diagnosis present

## 2015-03-31 DIAGNOSIS — M5432 Sciatica, left side: Secondary | ICD-10-CM

## 2015-03-31 DIAGNOSIS — Z791 Long term (current) use of non-steroidal anti-inflammatories (NSAID): Secondary | ICD-10-CM | POA: Insufficient documentation

## 2015-03-31 MED ORDER — ONDANSETRON 4 MG PO TBDP
4.0000 mg | ORAL_TABLET | Freq: Once | ORAL | Status: AC
  Administered 2015-03-31: 4 mg via ORAL
  Filled 2015-03-31: qty 1

## 2015-03-31 MED ORDER — HYDROCODONE-ACETAMINOPHEN 5-325 MG PO TABS: 2.0000 | ORAL_TABLET | ORAL | Status: DC | PRN

## 2015-03-31 MED ORDER — METHYLPREDNISOLONE 4 MG PO TBPK: ORAL_TABLET | ORAL | Status: DC

## 2015-03-31 MED ORDER — HYDROCODONE-ACETAMINOPHEN 5-325 MG PO TABS
1.0000 | ORAL_TABLET | Freq: Once | ORAL | Status: AC
  Administered 2015-03-31: 1 via ORAL
  Filled 2015-03-31: qty 1

## 2015-03-31 MED ORDER — NAPROXEN 500 MG PO TABS: 500.0000 mg | ORAL_TABLET | Freq: Two times a day (BID) | ORAL | Status: DC

## 2015-03-31 NOTE — ED Provider Notes (Signed)
CSN: [id number]     Arrival date & time 03/31/15  1244 History   First MD Initiated Contact with Patient 03/31/15 1257     Chief Complaint  Patient presents with  . Back Pain     HPI  Patient presents for evaluation of left lower back and left leg pain. States that "tender 12 years ago" she had some back problems. Sore which is expressing now. She had an MRI. I reviewed this. She recalls the results. It showed some mild right-sided disc bulges. She has never had right-sided symptoms. She does not have weakness in the leg. Symptoms for the last 2 days. Does not recall any inciting event. It is painful to walk and walks hunched over has difficulty with stridor of her left leg. No bowel or bladder symptoms. No numbness or weakness of the leg. No incontinence of bowel or bladder retention.  Past Medical History  Diagnosis Date  . Degenerative disc disease     L1 and L5 per pt, no meds   Past Surgical History  Procedure Laterality Date  . Cholecystectomy    . C sections    . Cesarean section      x 3  . Dilation and curettage of uterus    . Wisdom tooth extraction     Family History  Problem Relation Age of Onset  . Anesthesia problems Neg Hx    Social History  Substance Use Topics  . Smoking status: Never Smoker   . Smokeless tobacco: Never Used  . Alcohol Use: No   OB History    Gravida Para Term Preterm AB TAB SAB Ectopic Multiple Living   6 4 4  0 2 2 0 0 0 4     Review of Systems  Constitutional: Negative for fever, chills, diaphoresis, appetite change and fatigue.  HENT: Negative for mouth sores, sore throat and trouble swallowing.   Eyes: Negative for visual disturbance.  Respiratory: Negative for cough, chest tightness, shortness of breath and wheezing.   Cardiovascular: Negative for chest pain.  Gastrointestinal: Negative for nausea, vomiting, abdominal pain, diarrhea and abdominal distention.  Endocrine: Negative for polydipsia, polyphagia and polyuria.    Genitourinary: Negative for dysuria, frequency and hematuria.  Musculoskeletal: Negative for gait problem.       Left lower back and buttock pain radiating to left posterior thigh. No radiation medially.  Skin: Negative for color change, pallor and rash.  Neurological: Negative for dizziness, syncope, light-headedness and headaches.  Hematological: Does not bruise/bleed easily.  Psychiatric/Behavioral: Negative for behavioral problems and confusion.      Allergies  Review of patient's allergies indicates no known allergies.  Home Medications   Prior to Admission medications   Medication Sig Start Date End Date Taking? Authorizing Provider  escitalopram (LEXAPRO) 20 MG tablet TAKE ONE TABLET BY MOUTH ONCE DAILY. 10/31/14   [name], MD  HYDROcodone-acetaminophen (NORCO/VICODIN) 5-325 MG per tablet Take 2 tablets by mouth every 4 (four) hours as needed for moderate pain. 12/16/13   Gilda Crease, MD  HYDROcodone-acetaminophen (NORCO/VICODIN) 5-325 MG per tablet Take 2 tablets by mouth every 4 (four) hours as needed. 03/31/15   Rolland Porter, MD  ibuprofen (ADVIL,MOTRIN) 600 MG tablet Take 1 tablet (600 mg total) by mouth every 6 (six) hours as needed for pain. 12/25/12   Hope Orlene Och, NP  methylPREDNISolone (MEDROL DOSEPAK) 4 MG TBPK tablet 6 po on day 1, then decrease by 1 per day 03/31/15   Rolland Porter, MD  naproxen (  NAPROSYN) 500 MG tablet Take 1 tablet (500 mg total) by mouth 2 (two) times daily. 03/31/15   Rolland Porter, MD  oxyCODONE-acetaminophen (PERCOCET/ROXICET) 5-325 MG per tablet Take 1 tablet by mouth every 4 (four) hours as needed for pain. 12/25/12   Hope Orlene Och, NP   BP 125/57 mmHg  Pulse 78  Temp(Src) 97.8 F (36.6 C) (Oral)  Resp 18  Ht  (1.727 m)  Wt 155 lb (70.308 kg)  BMI 23.57 kg/m2  SpO2 100%  LMP 03/14/2015 Physical Exam  Constitutional: She is oriented to person, place, and time. She appears well-developed and well-nourished. No distress.  HENT:   Head: Normocephalic.  Eyes: Conjunctivae are normal. Pupils are equal, round, and reactive to light. No scleral icterus.  Neck: Normal range of motion. Neck supple. No thyromegaly present.  Cardiovascular: Normal rate and regular rhythm.  Exam reveals no gallop and no friction rub.   No murmur heard. Pulmonary/Chest: Effort normal and breath sounds normal. No respiratory distress. She has no wheezes. She has no rales.  Abdominal: Soft. Bowel sounds are normal. She exhibits no distension. There is no tenderness. There is no rebound.  Musculoskeletal: Normal range of motion.       Legs: Neurological: She is alert and oriented to person, place, and time.  Skin: Skin is warm and dry. No rash noted.  Psychiatric: She has a normal mood and affect. Her behavior is normal.    ED Course  Procedures (including critical care time) Labs Review Labs Reviewed - No data to display  Imaging Review No results found. I have personally reviewed and evaluated these images and lab results as part of my medical decision-making.   EKG Interpretation None      MDM   Final diagnoses:  Sciatica, left    Patient without radicular findings or symptoms. Long discussion about physical therapy Steroids versus antibiotics.  Plan is pain control stretching primary care follow-up. Given PT referral.    Rolland Porter, MD 03/31/15 [phone]

## 2015-03-31 NOTE — ED Notes (Signed)
PT c/o lower back pain radiating down her left leg x2 days with hx of back problems. PT denies any new injury and denies any urinary symptoms and last bowel movement 03/30/15.

## 2015-03-31 NOTE — ED Notes (Signed)
Patient with no complaints at this time. Respirations even and unlabored. Skin warm/dry. Discharge instructions reviewed with patient at this time. Patient given opportunity to voice concerns/ask questions. Patient discharged at this time and left Emergency Department with steady gait.   

## 2015-03-31 NOTE — Discharge Instructions (Signed)
Piriformis Syndrome °with Rehab °Piriformis syndrome is a condition the affects the nervous system in the area of the hip, and is characterized by pain and possibly a loss of feeling in the backside (posterior) thigh that may extend down the entire length of the leg. The symptoms are caused by an increase in pressure on the sciatic nerve by the piriformis muscle, which is on the back of the hip and is responsible for externally rotating the hip. The sciatic nerve and its branches connect to much of the leg. Normally the sciatic nerve runs between the piriformis muscle and other muscles. However, in certain individuals the nerve runs through the muscle, which causes an increase in pressure on the nerve and results in the symptoms of piriformis syndrome. °SYMPTOMS  °· Pain, tingling, numbness, or burning in the back of the thigh that may also extend down the entire leg. °· Occasionally, tenderness in the buttock. °· Loss of function of the leg. °· Pain that worsens when using the piriformis muscle (running, jumping, or stairs). °· Pain that increases with prolonged sitting. °· Pain that is lessened by lying flat on the back. °CAUSES  °· Piriformis syndrome is the result of an increase in pressure placed on the sciatic nerve. Oftentimes, piriformis syndrome is an overuse injury. °· Stress placed on the nerve from a sudden increase in the intensity, frequency, or duration of training. °· Compensation of other extremity injuries. °RISK INCREASES WITH: °· Sports that involve the piriformis muscle (running, walking, or jumping). °· You are born with (congenital) a defect in which the sciatic nerve passes through the muscle. °PREVENTION °· Warm up and stretch properly before activity. °· Allow for adequate recovery between workouts. °· Maintain physical fitness: °· Strength, flexibility, and endurance. °· Cardiovascular fitness. °PROGNOSIS  °If treated properly, the symptoms of piriformis syndrome usually resolve in 2 to 6  weeks. °RELATED COMPLICATIONS  °· Persistent and possibly permanent pain and numbness in the lower extremity. °· Weakness of the extremity that may progress to disability and inability to compete. °TREATMENT  °The most effective treatment for piriformis syndrome is rest from any activities that aggravate the symptoms. Ice and pain medication may help reduce pain and inflammation. The use of strengthening and stretching exercises may help reduce pain with activity. These exercises may be performed at home or with a therapist. A referral to a therapist may be given for further evaluation and treatment, such as ultrasound. Corticosteroid injections may be given to reduce inflammation that is causing pressure to be placed on the sciatic nerve. If nonsurgical (conservative) treatment is unsuccessful, then surgery may be recommended.  °MEDICATION  °· If pain medication is necessary, then nonsteroidal anti-inflammatory medications, such as aspirin and ibuprofen, or other minor pain relievers, such as acetaminophen, are often recommended. °· Do not take pain medication for 7 days before surgery. °· Prescription pain relievers may be given if deemed necessary by your caregiver. Use only as directed and only as much as you need. °· Corticosteroid injections may be given by your caregiver. These injections should be reserved for the most serious cases, because they may only be given a certain number of times. °HEAT AND COLD:  °· Cold treatment (icing) relieves pain and reduces inflammation. Cold treatment should be applied for 10 to 15 minutes every 2 to 3 hours for inflammation and pain and immediately after any activity that aggravates your symptoms. Use ice packs or massage the area with a piece of ice (ice massage). °· Heat   treatment may be used prior to performing the stretching and strengthening activities prescribed by your caregiver, physical therapist, or athletic trainer. Use a heat pack or soak the injury in warm  water. SEEK IMMEDIATE MEDICAL CARE IF:  Treatment seems to offer no benefit, or the condition worsens.  Any medications produce adverse side effects. EXERCISES RANGE OF MOTION (ROM) AND STRETCHING EXERCISES - Piriformis Syndrome These exercises may help you when beginning to rehabilitate your injury. Your symptoms may resolve with or without further involvement from your physician, physical therapist, or athletic trainer. While completing these exercises, remember:   Restoring tissue flexibility helps normal motion to return to the joints. This allows healthier, less painful movement and activity.  An effective stretch should be held for at least 30 seconds.  A stretch should never be painful. You should only feel a gentle lengthening or release in the stretched tissue. STRETCH - Hip Rotators  Lie on your back on a firm surface. Grasp your right / left knee with your right / left hand and your ankle with your opposite hand.  Keeping your hips and shoulders firmly planted, gently pull your right / left knee and rotate your lower leg toward your opposite shoulder until you feel a stretch in your buttocks. Hold this stretch for  STRETCH - Iliotibial Band  On the floor or bed, lie on your side so your right / left leg is on top. Bend your knee and grab your ankle.  Slowly bring your knee back so that your thigh is in line with your trunk. Keep your heel at your buttocks and gently arch your back so your head, shoulders, and hips line up.  Slowly lower your leg so that your knee approaches the floor/bed until you feel a gentle stretch on the outside of your right / left thigh. If you do not feel a stretch and your knee will not fall farther, place the heel of your opposite foot on top of your knee and pull your thigh down farther. Hold this stretch  STRENGTHENING EXERCISES - Piriformis Syndrome  These are some of the caregiver again or until your symptoms are resolved. Remember:   Strong  muscles with good endurance tolerate stress better.  Do the exercises as initially prescribed by your caregiver. Progress slowly with each exercise, gradually increasing the number of repetitions and weight used under their guidance. STRENGTH - Hip Abductors, Straight Leg Raises Be aware of your form throughout the entire exercise so that you exercise the correct muscles. Sloppy form means that you are not strengthening the correct muscles.  Lie on your side so that your head, shoulders, knee, and hip line up. You may bend your lower knee to help maintain your balance. Your right / left leg should be on top.  Roll your hips slightly forward, so that your hips are stacked directly over each other and your right / left knee is facing forward.  Lift your top leg up 4-6 inches, leading with your heel. Be sure that your foot does not drift forward or that your knee does not roll toward the ceiling.  Hold this position . You should feel the muscles in your outer hip lifting (you may not notice this until your leg begins to tire).  Slowly lower your leg to the starting position. Allow the muscles to fully relax before beginning the next repetition.   STRENGTH - Hip Abductors, Quadruped  On a firm, lightly padded surface, position yourself on your hands and knees.  Your hands should be directly below your shoulders and your knees should be directly below your hips.  Keeping your right / left knee bent, lift your leg out to the side. Keep your legs level and in line with your shoulders.  Position yourself on your hands and knees.  Hold   Keeping your trunk steady and your hips level, slowly lower your leg to the starting position.   STRENGTH - Hip Abductors, Standing  Tie one end of a rubber exercise band/tubing to a secure surface (table, pole) and tie a loop at the other end.  Place the loop around your right / left ankle. Keeping your ankle with the band directly opposite of the secured end,  step away until there is tension in the tube/band.  Hold onto a chair as needed for balance.  Keeping your back upright, your shoulders over your hips, and your toes pointing forward, lift your right / left leg out to your side. Be sure to lift your leg with your hip muscles. Do not "throw" your leg or tip your body to lift your leg.  Slowly and with control, return to the starting position. .  Document Released: 06/30/2005 Document Revised: 11/14/2013 Document Reviewed: 10/12/2008 [facility] Patient Information 2015 [place], [place]. This information is not intended to replace advice given to you by your health care provider. Make sure you discuss any questions you have with your health care provider.  Sciatica Sciatica is pain, weakness, numbness, or tingling along the path of the sciatic nerve. The nerve starts in the lower back and runs down the back of each leg. The nerve controls the muscles in the lower leg and in the back of the knee, while also providing sensation to the back of the thigh, lower leg, and the sole of your foot. Sciatica is a symptom of another medical condition. For instance, nerve damage or certain conditions, such as a herniated disk or bone spur on the spine, pinch or put pressure on the sciatic nerve. This causes the pain, weakness, or other sensations normally associated with sciatica. Generally, sciatica only affects one side of the body. CAUSES   Herniated or slipped disc.  Degenerative disk disease.  A pain disorder involving the narrow muscle in the buttocks (piriformis syndrome).  Pelvic injury or fracture.  Pregnancy.  Tumor (rare). SYMPTOMS  Symptoms can vary from mild to very severe. The symptoms usually travel from the low back to the buttocks and down the back of the leg. Symptoms can include:  Mild tingling or dull aches in the lower back, leg, or hip.  Numbness in the back of the calf or sole of the foot.  Burning sensations in the lower back,  leg, or hip.  Sharp pains in the lower back, leg, or hip.  Leg weakness.  Severe back pain inhibiting movement. These symptoms may get worse with coughing, sneezing, laughing, or prolonged sitting or standing. Also, being overweight may worsen symptoms. DIAGNOSIS  Your caregiver will perform a physical exam to look for common symptoms of sciatica. He or she may ask you to do certain movements or activities that would trigger sciatic nerve pain. Other tests may be performed to find the cause of the sciatica. These may include:    X-rays.  Imaging tests, such as an MRI or CT scan. TREATMENT  Treatment is directed at the cause of the sciatic pain. Sometimes, treatment is not necessary and the pain and discomfort goes away on its own. If treatment is needed,  your caregiver may suggest:  Over-the-counter medicines to relieve pain.  Prescription medicines, such as anti-inflammatory medicine, muscle relaxants, or narcotics.  Applying  ice to the painful area.  Steroid injections to lessen pain, irritation, and inflammation around the nerve.  Reducing activity during periods of pain.  Exercising and stretching to strengthen your abdomen and improve flexibility of your spine. Your caregiver may suggest losing weight if the extra weight makes the back pain worse.  Physical therapy.  Surgery to eliminate what is pressing or pinching the nerve, such as a bone spur or part of a herniated disk. HOME CARE INSTRUCTIONS   Only take over-the-counter or prescription medicines for pain or discomfort as directed by your caregiver.  Apply ice to the affected area for 20 minutes, 3-4 times a day for the first 48-72 hours. Then try heat in the same way.  Exercise, stretch, or perform your usual activities if these do not aggravate your pain.  Attend physical therapy sessions as directed by your caregiver.  Keep all follow-up appointments as directed by your caregiver.  Do not wear high heels or  shoes that do not provide proper support.  Check your mattress to see if it is too soft. A firm mattress may lessen your pain and discomfort. SEEK IMMEDIATE MEDICAL CARE IF:   You lose control of your bowel or bladder (incontinence).  You have increasing weakness in the lower back, pelvis, buttocks, or legs.  You have redness or swelling of your back.  You have a burning sensation when you urinate.  You have pain that gets worse when you lie down or awakens you at night.  Your pain is worse than you have experienced in the past.  Your pain is lasting longer than 4 weeks.  You are suddenly losing weight without reason. MAKE SURE YOU:  Understand these instructions.  Will watch your condition.  Will get help right away if you are not doing well or get worse. Document Released: 06/24/2001 Document Revised: 12/30/2011 Document Reviewed: 11/09/2011 [facility] Patient Information 2015 [place], [place]. This information is not intended to replace advice given to you by your health care provider. Make sure you discuss any questions you have with your health care provider.

## 2015-07-20 ENCOUNTER — Other Ambulatory Visit: Payer: Self-pay | Admitting: Obstetrics and Gynecology

## 2016-01-16 ENCOUNTER — Other Ambulatory Visit: Payer: Self-pay | Admitting: Obstetrics and Gynecology

## 2016-04-20 ENCOUNTER — Emergency Department (HOSPITAL_COMMUNITY): Payer: Self-pay

## 2016-04-20 ENCOUNTER — Encounter (HOSPITAL_COMMUNITY): Payer: Self-pay | Admitting: Adult Health

## 2016-04-20 ENCOUNTER — Emergency Department (HOSPITAL_COMMUNITY)
Admission: EM | Admit: 2016-04-20 | Discharge: 2016-04-20 | Disposition: A | Discharge status: Home / Self Care | Payer: Self-pay | Attending: Emergency Medicine | Admitting: Emergency Medicine

## 2016-04-20 DIAGNOSIS — Z79899 Other long term (current) drug therapy: Secondary | ICD-10-CM | POA: Insufficient documentation

## 2016-04-20 DIAGNOSIS — H6690 Otitis media, unspecified, unspecified ear: Secondary | ICD-10-CM

## 2016-04-20 DIAGNOSIS — H6691 Otitis media, unspecified, right ear: Secondary | ICD-10-CM | POA: Insufficient documentation

## 2016-04-20 MED ORDER — AMOXICILLIN 250 MG PO CAPS
500.0000 mg | ORAL_CAPSULE | Freq: Once | ORAL | Status: AC
  Administered 2016-04-20: 500 mg via ORAL
  Filled 2016-04-20: qty 2

## 2016-04-20 MED ORDER — AMOXICILLIN 500 MG PO CAPS: 500.0000 mg | ORAL_CAPSULE | Freq: Two times a day (BID) | ORAL | 0 refills | Status: AC

## 2016-04-20 MED ORDER — IBUPROFEN 400 MG PO TABS
400.0000 mg | ORAL_TABLET | Freq: Once | ORAL | Status: AC
  Administered 2016-04-20: 400 mg via ORAL
  Filled 2016-04-20: qty 1

## 2016-04-20 NOTE — ED Notes (Signed)
Patient transported to CT 

## 2016-04-20 NOTE — ED Provider Notes (Signed)
[facility]-EMERGENCY DEPT Provider Note   CSN: [id number] Arrival date & time: 04/20/16  1825   By signing my name below, I, [name], attest that this documentation has been prepared under the direction and in the presence of non-physician practitioner, Arvilla Meres, PA-C. Electronically Signed: Nelwyn Carrillo, Scribe. 04/20/2016. 6:57 PM.   History   Chief Complaint Chief Complaint  Patient presents with  . Otalgia   The history is provided by the patient. No language interpreter was used.    HPI Comments:  [name] is a 40 y.o. female who presents to the Emergency Department complaining of constant gradually worsening right sided ear pain onset 1 week ago. Pt describes her pain as an aching, throbbing pain that has gradually worsened over the past 2 days, alleviated slightly by warm compresses. Pt also notes that she has tried tylenol and peroxide with minimal relief. She endorses associated intermittent nausea and headache. Pt denies any vomiting, ear drainage, dizziness, light headedness, fever, trouble swallowing, neck pain, photophobia, eye pain, sinus pressure, exposure to pool water, recent trauma, or outside sick contacts.  She reports she had a particularly bad upper respiratory infection about 2 weeks ago but her other symptoms have resolved.   Past Medical History:  Diagnosis Date  . Degenerative disc disease    L1 and L5 per pt, no meds    Patient Active Problem List   Diagnosis Date Noted  . 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 Sterilization 11/13/2011    Past Surgical History:  Procedure Laterality Date  . c sections    . CESAREAN SECTION     x 3  . CHOLECYSTECTOMY    . DILATION AND CURETTAGE OF UTERUS    . WISDOM TOOTH EXTRACTION      OB History    Gravida Para Term Preterm AB Living   6 4 4  0 2 4   SAB TAB Ectopic Multiple Live Births   0 2 0 0 1       Home Medications    Prior to Admission medications   Medication Sig Start  Date End Date Taking? Authorizing Provider  escitalopram (LEXAPRO) 20 MG tablet TAKE ONE TABLET BY MOUTH ONCE DAILY. 01/16/16  Yes [name], MD  amoxicillin (AMOXIL) 500 MG capsule Take 1 capsule (500 mg total) by mouth 2 (two) times daily. 04/20/16 04/25/16  Lona Kettle, PA-C    Family History Family History  Problem Relation Age of Onset  . Anesthesia problems Neg Hx     Social History Social History  Substance Use Topics  . Smoking status: Never Smoker  . Smokeless tobacco: Never Used  . Alcohol use No     Allergies   Review of patient's allergies indicates no known allergies.   Review of Systems Review of Systems  HENT: Positive for ear pain. Negative for ear discharge, sinus pressure and trouble swallowing.   Eyes: Negative for photophobia and pain.  Gastrointestinal: Positive for nausea. Negative for vomiting.  Musculoskeletal: Negative for neck pain.  Neurological: Positive for headaches. Negative for dizziness and light-headedness.     Physical Exam Updated Vital Signs BP 135/81 (BP Location: Right Arm)   Pulse 74   Temp 98.2 F (36.8 C) (Oral)   Resp 16   Wt 70.3 kg   LMP 03/21/2016 (Approximate)   SpO2 99%   BMI 23.57 kg/m   Physical Exam  Constitutional: She appears well-developed and well-nourished. No distress.  HENT:  Head: Normocephalic and  atraumatic.  Right Ear: Ear canal normal. There is mastoid tenderness. Tympanic membrane is injected and bulging.  Left Ear: Tympanic membrane, external ear and ear canal normal.  Mouth/Throat: Uvula is midline, oropharynx is clear and moist and mucous membranes are normal. No trismus in the jaw. No oropharyngeal exudate. No tonsillar exudate.  Normal appearing left external ear, canal, and TM Right Ear: Mild tenderness to mastoid; no erythema, warmth, or swelling. Normal appearing pinna, tragus, and ear canal. TM bulging with injection. Yellow fluid noted behind TM.   Eyes: Conjunctivae are  normal. No scleral icterus.  Neck: Normal range of motion. Neck supple. No neck rigidity. Normal range of motion present.  No nuchal rigidity.   Cardiovascular: Normal rate and regular rhythm.   No murmur heard. Pulmonary/Chest: Effort normal and breath sounds normal. No respiratory distress.  Abdominal: Soft. There is no tenderness.  Musculoskeletal: She exhibits no edema.  Neurological: She is alert.  Skin: Skin is warm and dry. She is not diaphoretic.  Psychiatric: She has a normal mood and affect. Her behavior is normal.  Nursing note and vitals reviewed.    ED Treatments / Results  DIAGNOSTIC STUDIES:  Oxygen Saturation is 99% on RA, normal by my interpretation.    COORDINATION OF CARE:  7:08 PM Discussed treatment plan with pt at bedside which includes amoxicillin and pt agreed to plan.  Labs (all labs ordered are listed, but only abnormal results are displayed) Labs Reviewed - No data to display  EKG  EKG Interpretation None       Radiology Ct Temporal Bones Wo Contrast  Result Date: 04/20/2016 CLINICAL DATA:  Worsening right-sided ear pain for 1 week. Tenderness over the mastoid process. EXAM: CT TEMPORAL BONES WITHOUT CONTRAST TECHNIQUE: Axial and coronal plane CT imaging of the petrous temporal bones was performed with thin-collimation image reconstruction. No intravenous contrast was administered. Multiplanar CT image reconstructions were also generated. COMPARISON:  Head CT 12/27/2009 FINDINGS: RIGHT: The external auditory canal and tympanic membrane are unremarkable. The ossicles appear intact. The tympanic cavity is clear. The internal auditory canal, cochlea, vestibule, and semicircular canals are unremarkable. The mastoid air cells are clear. LEFT: The external auditory canal and tympanic membrane are unremarkable. The ossicles appear intact. The tympanic cavity is clear. The internal auditory canal, cochlea, vestibule, and semicircular canals are unremarkable.  The mastoid air cells are clear. The visualized paranasal sinuses are clear. The visualized portion of the brain is unremarkable. Visualized orbits are unremarkable. IMPRESSION: Unremarkable temporal bone CT. Electronically Signed   By: Sebastian AcheAllen  Grady M.D.   On: 04/20/2016 19:58    Procedures Procedures (including critical care time)  Medications Ordered in ED Medications  amoxicillin (AMOXIL) capsule 500 mg (not administered)  ibuprofen (ADVIL,MOTRIN) tablet 400 mg (not administered)     Initial Impression / Assessment and Plan / ED Course  I have reviewed the triage vital signs and the nursing notes.  Pertinent labs & imaging results that were available during my care of the patient were reviewed by me and considered in my medical decision making (see chart for details).  Clinical Course  Value Comment By Time  CT Temporal Bones Wo Contrast Reviewed Lona Kettleshley Laurel Oma Marzan, PA-C 10/08 2003    Patient presents to ED with complaint of right ear pain x 1 week, recent URI sxs. Patient is afebrile and non-toxic appearing in NAD. VSS.  Patient presents with otalgia and exam consistent with acute otitis media. Mild TTP of right mastoid, no erythema, warmth,  or swelling appreciated.  CT temporal bone negative for mastoiditis. Low suspicion for meningitis - afebrile, no nuchal rigidity. Rx amoxicillin. Symptomatic relief with tylenol or motrin. Follow up with PCP if sxs persist for more than 2-3 days after onset of ABX use. Return precautions given. Parent expresses understanding and agrees with plan.    Final Clinical Impressions(s) / ED Diagnoses   Final diagnoses:  Acute otitis media, unspecified otitis media type    New Prescriptions New Prescriptions   AMOXICILLIN (AMOXIL) 500 MG CAPSULE    Take 1 capsule (500 mg total) by mouth 2 (two) times daily.  I personally performed the services described in this documentation, which was scribed in my presence. The recorded information has been  reviewed and is accurate.     Lona Kettle, [place] 04/20/16 2004    Glynn Octave, MD 04/21/16 [phone]

## 2016-04-20 NOTE — ED Triage Notes (Signed)
Presents with right sided ear pain began steadily two days ago. Denies difficulty hearing and drainage. Tympanic membrane red and bulging. Denies fevers. HX of recent URI.

## 2016-04-20 NOTE — Discharge Instructions (Signed)
Read the information below.  Your imaging was normal. You are being prescribed antibiotics for an ear infection. Please take as directed. You can take tylenol or motrin for pain relief.  Use the prescribed medication as directed.  Please discuss all new medications with your pharmacist.   Follow up with your primary doctor if your symptoms persist for more than 2-3 days after starting antibiotics.  You may return to the Emergency Department at any time for worsening condition or any new symptoms that concern you.

## 2016-10-06 ENCOUNTER — Other Ambulatory Visit: Payer: Self-pay | Admitting: Obstetrics and Gynecology

## 2016-10-06 NOTE — Telephone Encounter (Signed)
Needs appt for discussion of Lexapro Rx.

## 2016-10-20 ENCOUNTER — Telehealth: Payer: Self-pay | Admitting: *Deleted

## 2016-10-20 NOTE — Telephone Encounter (Signed)
She feels that lexapro not working, has not been seen lately, will make appt for Friday for pap and physical

## 2016-10-24 ENCOUNTER — Encounter: Payer: Self-pay | Admitting: Adult Health

## 2016-10-24 ENCOUNTER — Ambulatory Visit (INDEPENDENT_AMBULATORY_CARE_PROVIDER_SITE_OTHER): Payer: BLUE CROSS/BLUE SHIELD | Admitting: Adult Health

## 2016-10-24 ENCOUNTER — Other Ambulatory Visit: Payer: Self-pay | Admitting: Adult Health

## 2016-10-24 ENCOUNTER — Other Ambulatory Visit (HOSPITAL_COMMUNITY)
Admission: RE | Admit: 2016-10-24 | Discharge: 2016-10-24 | Disposition: A | Discharge status: Home / Self Care | Payer: BLUE CROSS/BLUE SHIELD | Source: Ambulatory Visit | Attending: Adult Health | Admitting: Adult Health

## 2016-10-24 VITALS — BP 110/72 | HR 75 | Ht 68.5 in | Wt 192.0 lb

## 2016-10-24 DIAGNOSIS — Z1211 Encounter for screening for malignant neoplasm of colon: Secondary | ICD-10-CM

## 2016-10-24 DIAGNOSIS — F32A Depression, unspecified: Secondary | ICD-10-CM

## 2016-10-24 DIAGNOSIS — K5900 Constipation, unspecified: Secondary | ICD-10-CM | POA: Diagnosis not present

## 2016-10-24 DIAGNOSIS — Z01419 Encounter for gynecological examination (general) (routine) without abnormal findings: Secondary | ICD-10-CM | POA: Diagnosis not present

## 2016-10-24 DIAGNOSIS — F329 Major depressive disorder, single episode, unspecified: Secondary | ICD-10-CM | POA: Diagnosis not present

## 2016-10-24 DIAGNOSIS — Z1212 Encounter for screening for malignant neoplasm of rectum: Secondary | ICD-10-CM

## 2016-10-24 DIAGNOSIS — Z1231 Encounter for screening mammogram for malignant neoplasm of breast: Secondary | ICD-10-CM

## 2016-10-24 LAB — HEMOCCULT GUIAC POC 1CARD (OFFICE): FECAL OCCULT BLD: NEGATIVE

## 2016-10-24 MED ORDER — BUSPIRONE HCL 5 MG PO TABS: 5.0000 mg | ORAL_TABLET | Freq: Three times a day (TID) | ORAL | 1 refills | Status: DC

## 2016-10-24 MED ORDER — ESCITALOPRAM OXALATE 20 MG PO TABS: 20.0000 mg | ORAL_TABLET | Freq: Every day | ORAL | 1 refills | Status: DC

## 2016-10-24 MED ORDER — LINACLOTIDE 145 MCG PO CAPS: 145.0000 ug | ORAL_CAPSULE | Freq: Every day | ORAL | 3 refills | Status: DC

## 2016-10-24 NOTE — Progress Notes (Signed)
Patient ID: [name], female   DOB: 04-Mar-1976, 41 y.o.   MRN: [id number] History of Present Illness: [name] is a 41 year old white female in for well woman gyn exam and pap.She works as Lawyer. PCP is [facility]    Current Medications, Allergies, Past Medical History, Past Surgical History, Family History and Social History were reviewed in [employer] record.     Review of Systems: Patient denies any headaches, hearing loss, fatigue, blurred vision, shortness of breath, chest pain, abdominal pain, problems with urination, or intercourse. No joint pain or mood swings. Constipated, has to use enemas at times.Periods more irregular.    Physical Exam:BP 110/72 (BP Location: Right Arm, Patient Position: Sitting, Cuff Size: Normal)   Pulse 75   Ht 5' 8.5" (1.74 m)   Wt 192 lb (87.1 kg)   LMP 10/14/2016 (Approximate)   BMI 28.77 kg/m  General:  Well developed, well nourished, no acute distress Skin:  Warm and dry, tanned with numerous tattoos. Neck:  Midline trachea, normal thyroid, good ROM, no lymphadenopathy Lungs; Clear to auscultation bilaterally Breast:  No dominant palpable mass, retraction, or nipple discharge Cardiovascular: Regular rate and rhythm Abdomen:  Soft, non tender, no hepatosplenomegaly Pelvic:  External genitalia is normal in appearance, no lesions.  The vagina is normal in appearance. Urethra has no lesions or masses. The cervix is bulbous.Pap with GC/CHL and HPV performed.  Uterus is felt to be normal size, shape, and contour.  No adnexal masses or tenderness noted.Bladder is non tender, no masses felt. Rectal: Good sphincter tone, no polyps, or hemorrhoids felt.  Hemoccult negative. Extremities/musculoskeletal:  No swelling or varicosities noted, no clubbing or cyanosis Psych:  No mood changes, alert and cooperative,seems happy PHQ 9 score 15, denies being suicidal but lexapro not working as well.Will add buspar to lexapro for  now.  Impression: 1. Encounter for gynecological examination with Papanicolaou smear of cervix   2. Screening for colorectal cancer   3. Depression, unspecified depression type   4. Constipation, unspecified constipation type       Plan: Rx lexapro 20 mg #30 take 1 daily with 1 refill Rx buspar 5 mg #90 take 1 tid with 1 refill Rx linzess 145 mcg #30 take 1 daily with 3 refills Check CBC,CMP,TSH and lipids Follow up in 4 weeks Get mammogram now and yearly Physical in 1 year, pap in 3 if normal

## 2016-10-25 LAB — CBC
Hematocrit: 43.2 % (ref 34.0–46.6)
Hemoglobin: 14.6 g/dL (ref 11.1–15.9)
MCH: 29.8 pg (ref 26.6–33.0)
MCHC: 33.8 g/dL (ref 31.5–35.7)
MCV: 88 fL (ref 79–97)
Platelets: 286 10*3/uL (ref 150–379)
RBC: 4.9 x10E6/uL (ref 3.77–5.28)
RDW: 14.3 % (ref 12.3–15.4)
WBC: 8.6 10*3/uL (ref 3.4–10.8)

## 2016-10-25 LAB — COMPREHENSIVE METABOLIC PANEL
ALK PHOS: 69 IU/L (ref 39–117)
ALT: 30 IU/L (ref 0–32)
AST: 26 IU/L (ref 0–40)
Albumin/Globulin Ratio: 1.7 (ref 1.2–2.2)
Albumin: 4.6 g/dL (ref 3.5–5.5)
BUN/Creatinine Ratio: 12 (ref 9–23)
BUN: 12 mg/dL (ref 6–24)
Bilirubin Total: 0.2 mg/dL (ref 0.0–1.2)
CO2: 24 mmol/L (ref 18–29)
CREATININE: 1 mg/dL (ref 0.57–1.00)
Calcium: 9.4 mg/dL (ref 8.7–10.2)
Chloride: 99 mmol/L (ref 96–106)
GFR calc non Af Amer: 70 mL/min/{1.73_m2} (ref >59)
GFR, EST AFRICAN AMERICAN: 80 mL/min/{1.73_m2} (ref >59)
GLUCOSE: 88 mg/dL (ref 65–99)
Globulin, Total: 2.7 g/dL (ref 1.5–4.5)
Potassium: 4.4 mmol/L (ref 3.5–5.2)
Sodium: 139 mmol/L (ref 134–144)
TOTAL PROTEIN: 7.3 g/dL (ref 6.0–8.5)

## 2016-10-25 LAB — LIPID PANEL
Chol/HDL Ratio: 3.1 ratio (ref 0.0–4.4)
Cholesterol, Total: 211 mg/dL — ABNORMAL HIGH (ref 100–199)
HDL: 68 mg/dL (ref >39)
LDL Calculated: 117 mg/dL — ABNORMAL HIGH (ref 0–99)
Triglycerides: 128 mg/dL (ref 0–149)
VLDL CHOLESTEROL CAL: 26 mg/dL (ref 5–40)

## 2016-10-25 LAB — TSH: TSH: 2.8 u[IU]/mL (ref 0.450–4.500)

## 2016-10-28 LAB — CYTOLOGY - PAP
ADEQUACY: ABSENT
Chlamydia: NEGATIVE
Diagnosis: NEGATIVE
HPV: NOT DETECTED
Neisseria Gonorrhea: NEGATIVE

## 2016-11-05 ENCOUNTER — Ambulatory Visit (HOSPITAL_COMMUNITY): Payer: Self-pay

## 2016-11-12 ENCOUNTER — Telehealth: Payer: Self-pay | Admitting: Adult Health

## 2016-11-12 MED ORDER — ESCITALOPRAM OXALATE 20 MG PO TABS: 20.0000 mg | ORAL_TABLET | Freq: Every day | ORAL | 1 refills | Status: DC

## 2016-11-12 MED ORDER — BUSPIRONE HCL 5 MG PO TABS: 5.0000 mg | ORAL_TABLET | Freq: Three times a day (TID) | ORAL | 1 refills | Status: DC

## 2016-11-12 NOTE — Telephone Encounter (Signed)
Left message that can cancel appt for 5/11 and will refill meds but would like to see first part of June in F/U

## 2016-11-20 ENCOUNTER — Ambulatory Visit (HOSPITAL_COMMUNITY): Payer: Self-pay

## 2016-11-20 ENCOUNTER — Ambulatory Visit (HOSPITAL_COMMUNITY)
Admission: RE | Admit: 2016-11-20 | Discharge: 2016-11-20 | Disposition: A | Discharge status: Home / Self Care | Payer: BLUE CROSS/BLUE SHIELD | Source: Ambulatory Visit | Attending: Adult Health | Admitting: Adult Health

## 2016-11-20 DIAGNOSIS — Z1231 Encounter for screening mammogram for malignant neoplasm of breast: Secondary | ICD-10-CM | POA: Insufficient documentation

## 2016-11-21 ENCOUNTER — Ambulatory Visit: Payer: BLUE CROSS/BLUE SHIELD | Admitting: Adult Health

## 2016-12-20 ENCOUNTER — Other Ambulatory Visit: Payer: Self-pay | Admitting: Adult Health

## 2017-03-09 ENCOUNTER — Telehealth: Payer: Self-pay | Admitting: *Deleted

## 2017-03-09 NOTE — Telephone Encounter (Signed)
LMOVM that she will need a follow-up appointment to have Lexapro refilled.

## 2017-03-10 ENCOUNTER — Other Ambulatory Visit: Payer: Self-pay | Admitting: Adult Health

## 2017-04-30 ENCOUNTER — Other Ambulatory Visit: Payer: Self-pay | Admitting: Adult Health

## 2017-07-30 ENCOUNTER — Telehealth: Payer: Self-pay | Admitting: Adult Health

## 2017-07-30 NOTE — Telephone Encounter (Signed)
Patient called stating that she would like a call back from Jennifer, Patient did not state the reason for the call. Please contact pt 

## 2017-07-30 NOTE — Telephone Encounter (Signed)
LMOVM returning patient's call.  

## 2017-07-30 NOTE — Telephone Encounter (Signed)
Patient called requesting prescription for sleeping medication. Has tried melatonin, Benadryl, etc and nothing is working. Says this is has been going on for 5 years. Please advise.

## 2017-07-31 NOTE — Telephone Encounter (Signed)
Informed patient she will need to be seen for insomnia prescription. Pt states she will wait until annual appointment.

## 2017-11-18 ENCOUNTER — Other Ambulatory Visit: Payer: Self-pay | Admitting: Adult Health

## 2018-04-19 ENCOUNTER — Other Ambulatory Visit: Payer: Self-pay | Admitting: Adult Health

## 2018-04-19 DIAGNOSIS — Z1231 Encounter for screening mammogram for malignant neoplasm of breast: Secondary | ICD-10-CM

## 2018-04-23 ENCOUNTER — Ambulatory Visit (HOSPITAL_COMMUNITY)
Admission: RE | Admit: 2018-04-23 | Discharge: 2018-04-23 | Disposition: A | Discharge status: Home / Self Care | Payer: BLUE CROSS/BLUE SHIELD | Source: Ambulatory Visit | Attending: Adult Health | Admitting: Adult Health

## 2018-04-23 DIAGNOSIS — Z1231 Encounter for screening mammogram for malignant neoplasm of breast: Secondary | ICD-10-CM | POA: Diagnosis present

## 2018-07-10 ENCOUNTER — Encounter (HOSPITAL_COMMUNITY): Payer: Self-pay | Admitting: Emergency Medicine

## 2018-07-10 ENCOUNTER — Other Ambulatory Visit: Payer: Self-pay

## 2018-07-10 ENCOUNTER — Emergency Department (HOSPITAL_COMMUNITY)
Admission: EM | Admit: 2018-07-10 | Discharge: 2018-07-10 | Disposition: A | Discharge status: Home / Self Care | Payer: BLUE CROSS/BLUE SHIELD | Attending: Emergency Medicine | Admitting: Emergency Medicine

## 2018-07-10 DIAGNOSIS — Y9389 Activity, other specified: Secondary | ICD-10-CM | POA: Insufficient documentation

## 2018-07-10 DIAGNOSIS — Y999 Unspecified external cause status: Secondary | ICD-10-CM | POA: Insufficient documentation

## 2018-07-10 DIAGNOSIS — W208XXA Other cause of strike by thrown, projected or falling object, initial encounter: Secondary | ICD-10-CM | POA: Insufficient documentation

## 2018-07-10 DIAGNOSIS — Y929 Unspecified place or not applicable: Secondary | ICD-10-CM | POA: Diagnosis not present

## 2018-07-10 DIAGNOSIS — Z23 Encounter for immunization: Secondary | ICD-10-CM | POA: Insufficient documentation

## 2018-07-10 DIAGNOSIS — Z79899 Other long term (current) drug therapy: Secondary | ICD-10-CM | POA: Insufficient documentation

## 2018-07-10 DIAGNOSIS — S91312A Laceration without foreign body, left foot, initial encounter: Secondary | ICD-10-CM

## 2018-07-10 MED ORDER — TETANUS-DIPHTH-ACELL PERTUSSIS 5-2.5-18.5 LF-MCG/0.5 IM SUSP
0.5000 mL | Freq: Once | INTRAMUSCULAR | Status: AC
  Administered 2018-07-10: 0.5 mL via INTRAMUSCULAR
  Filled 2018-07-10: qty 0.5

## 2018-07-10 MED ORDER — LIDOCAINE HCL (PF) 2 % IJ SOLN
INTRAMUSCULAR | Status: AC
  Filled 2018-07-10: qty 20

## 2018-07-10 MED ORDER — LIDOCAINE HCL (PF) 2 % IJ SOLN
5.0000 mL | Freq: Once | INTRAMUSCULAR | Status: AC
Start: 2018-07-10 — End: 2018-07-10
  Administered 2018-07-10: 5 mL via INTRADERMAL

## 2018-07-10 NOTE — Discharge Instructions (Addendum)
Elevate your foot when possible.  Clean the wound with mild soap and water.  Keep it bandaged.  Ibuprofen every 6-8 hours if needed for pain.  Sutures out in 8 to 10 days.  Return here for any signs of infection such as increasing pain, redness, swelling, or red streaking.

## 2018-07-10 NOTE — ED Provider Notes (Signed)
[facility] EMERGENCY DEPARTMENT Provider Note   CSN: [id number] Arrival date & time: 07/10/18  1442     History   Chief Complaint Chief Complaint  Patient presents with  . Laceration    HPI [name] is a 42 y.o. female.  HPI [name] is a 42 y.o. female who presents to the Emergency Department complaining of laceration of her left foot that occurred shortly before ER arrival.  She states that she was carrying tin when she accidentally dropped it on her foot.  She complains of a laceration to the proximal left foot.  Bleeding was controlled with direct pressure.  She complains of soreness with weightbearing, but denies numbness of her foot or ankle, swelling, and pain proximal to the ankle.  Last TD is unknown.  She states the tin was new and not rusty.  She does not take blood thinners  Past Medical History:  Diagnosis Date  . Degenerative disc disease    L1 and L5 per pt, no meds    Patient Active Problem List   Diagnosis Date Noted  . 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 Sterilization 11/13/2011    Past Surgical History:  Procedure Laterality Date  . c sections    . CESAREAN SECTION     x 3  . CHOLECYSTECTOMY    . DILATION AND CURETTAGE OF UTERUS    . TUBAL LIGATION    . WISDOM TOOTH EXTRACTION       OB History    Gravida  6   Para  4   Term  4   Preterm  0   AB  2   Living  4     SAB  0   TAB  2   Ectopic  0   Multiple  0   Live Births  1            Home Medications    Prior to Admission medications   Medication Sig Start Date End Date Taking? Authorizing Provider  busPIRone (BUSPAR) 5 MG tablet TAKE (1) TABLET BY MOUTH (3) TIMES DAILY. 04/30/17   Adline PotterGriffin, Jennifer A, NP  escitalopram (LEXAPRO) 20 MG tablet TAKE (1) TABLET BY MOUTH ONCE DAILY. 11/19/17   Adline PotterGriffin, Jennifer A, NP  LINZESS 145 MCG CAPS capsule TAKE (1) CAPSULE BY MOUTH EVERY DAY. 11/19/17   Adline PotterGriffin, Jennifer A, NP    Family History Family  History  Problem Relation Age of Onset  . Cancer Maternal Grandmother   . Diabetes Maternal Grandmother   . Anesthesia problems Neg Hx     Social History Social History   Tobacco Use  . Smoking status: Never Smoker  . Smokeless tobacco: Never Used  Substance Use Topics  . Alcohol use: No  . Drug use: No     Allergies   Patient has no known allergies.   Review of Systems Review of Systems  Constitutional: Negative for chills and fever.  Musculoskeletal: Negative for arthralgias, back pain and joint swelling.  Skin: Positive for wound.       Laceration left foot  Neurological: Negative for dizziness, weakness and numbness.  Hematological: Does not bruise/bleed easily.     Physical Exam Updated Vital Signs BP 124/76 (BP Location: Right Arm)   Pulse 75   Temp 97.8 F (36.6 C) (Oral)   Resp 16   Ht 5\' 8"  (1.727 m)   Wt 70.3 kg   LMP 07/07/2018 (Exact Date)   SpO2 100%  BMI 23.57 kg/m   Physical Exam Vitals signs and nursing note reviewed.  Constitutional:      General: She is not in acute distress.    Appearance: She is well-developed.  HENT:     Head: Atraumatic.  Cardiovascular:     Rate and Rhythm: Normal rate and regular rhythm.     Pulses: Normal pulses.     Heart sounds: No murmur.  Pulmonary:     Effort: Pulmonary effort is normal. No respiratory distress.     Breath sounds: Normal breath sounds.  Musculoskeletal:        General: Signs of injury present. No swelling or tenderness.     Left ankle: She exhibits laceration. She exhibits normal range of motion, no swelling and normal pulse. No lateral malleolus and no medial malleolus tenderness found.       Feet:     Comments: Laceration of the proximal left foot.  Bleeding controlled.  No edema. NO Fb's.  Pt has full dorsiflexion and plantar flexion of the left foot.  Ankle joint is non-tender  Skin:    General: Skin is warm.     Capillary Refill: Capillary refill takes less than 2 seconds.      Findings: No laceration.  Neurological:     General: No focal deficit present.     Mental Status: She is alert.     Sensory: No sensory deficit.     Motor: No weakness or abnormal muscle tone.      ED Treatments / Results  Labs (all labs ordered are listed, but only abnormal results are displayed) Labs Reviewed - No data to display  EKG None  Radiology No results found.  Procedures Procedures (including critical care time)  LACERATION REPAIR Performed by: Haeleigh Streiff Authorized by: Magalene Mclear Consent: Verbal consent obtained. Risks and benefits: risks, benefits and alternatives were discussed Consent given by: patient Patient identity confirmed: provided demographic data Prepped and Draped in normal sterile fashion Wound explored  Laceration Location: left foot  Laceration Length: 4 cm  No Foreign Bodies seen or palpated  Anesthesia: local infiltration  Local anesthetic: lidocaine 2% w/o epinephrine  Anesthetic total: 4 ml  Irrigation method: syringe Amount of cleaning: standard  Skin closure: 4-0 prolene  Number of sutures: 7  Technique: simple interrupted  Patient tolerance: Patient tolerated the procedure well with no immediate complications.   Medications Ordered in ED Medications  lidocaine (XYLOCAINE) 2 % injection (has no administration in time range)  lidocaine (XYLOCAINE) 2 % injection 5 mL (5 mLs Intradermal Given by Other 07/10/18 1552)  Tdap (BOOSTRIX) injection 0.5 mL (0.5 mLs Intramuscular Given 07/10/18 1551)     Initial Impression / Assessment and Plan / ED Course  I have reviewed the triage vital signs and the nursing notes.  Pertinent labs & imaging results that were available during my care of the patient were reviewed by me and considered in my medical decision making (see chart for details).     Td given.  Laceration to left foot.  Bleeding controlled prior to closure.  Wound explored.  No motor or sensory deficits.   No injury to deeper structures seen.  Wound edges well approximated.  Patient agrees to treatment with wound care instructions given and sutures out in 8 to 10 days.  Tylenol or ibuprofen if needed for pain.  Final Clinical Impressions(s) / ED Diagnoses   Final diagnoses:  Laceration of left foot, initial encounter    ED Discharge Orders  None       Pauline Aus, PA-C 07/10/18 1651    Gerhard Munch, MD 07/10/18 2352

## 2018-07-10 NOTE — ED Triage Notes (Signed)
Dropped roofing tin on L ankle area   1 in cut   FROM/sensation

## 2018-09-22 ENCOUNTER — Other Ambulatory Visit: Payer: Self-pay | Admitting: Adult Health

## 2018-11-19 ENCOUNTER — Telehealth: Payer: Self-pay | Admitting: *Deleted

## 2018-11-19 ENCOUNTER — Telehealth: Payer: Self-pay | Admitting: Adult Health

## 2018-11-19 NOTE — Telephone Encounter (Signed)
Patient called, stated she has been on the same medication for the last 7 years and it's not helping.  She would like something different.  [facility]  [phone]

## 2018-11-19 NOTE — Telephone Encounter (Signed)
Pt aware to look for our call Monday for televisit. Pt voiced understanding. JSY

## 2018-11-19 NOTE — Telephone Encounter (Signed)
It appears [name] has been managing that but the patient has not been seen in this office for 2 years   She will need to make a tele or webex visit with [name] before any changes to her medication regimen is made

## 2018-11-19 NOTE — Telephone Encounter (Signed)
Has been on Lexapro x 7 years. Also on Buspar 5 mg. Pt is taking 3 tabs of the Buspar. Pt feels anxious and ill. Can't relax. Feels nervous all the time. Pt don't feel suicidal or that she could harm anyone else. Can something be changed? Thanks!! JSY

## 2018-11-19 NOTE — Telephone Encounter (Signed)
Left message letting pt know of Dr. Forestine Chute recommendations. Advised to schedule televisit or webex with JAG. JSY

## 2018-11-22 ENCOUNTER — Other Ambulatory Visit: Payer: Self-pay

## 2018-11-22 ENCOUNTER — Encounter: Payer: Self-pay | Admitting: Adult Health

## 2018-11-22 ENCOUNTER — Ambulatory Visit (INDEPENDENT_AMBULATORY_CARE_PROVIDER_SITE_OTHER): Payer: PRIVATE HEALTH INSURANCE | Admitting: Adult Health

## 2018-11-22 VITALS — Ht 68.0 in

## 2018-11-22 DIAGNOSIS — F329 Major depressive disorder, single episode, unspecified: Secondary | ICD-10-CM | POA: Diagnosis not present

## 2018-11-22 DIAGNOSIS — F419 Anxiety disorder, unspecified: Secondary | ICD-10-CM | POA: Insufficient documentation

## 2018-11-22 DIAGNOSIS — F32A Depression, unspecified: Secondary | ICD-10-CM

## 2018-11-22 MED ORDER — ESCITALOPRAM OXALATE 20 MG PO TABS: ORAL_TABLET | ORAL | 6 refills | Status: DC

## 2018-11-22 MED ORDER — BUSPIRONE HCL 7.5 MG PO TABS: 7.5000 mg | ORAL_TABLET | Freq: Three times a day (TID) | ORAL | 3 refills | Status: DC

## 2018-11-22 MED ORDER — TRAZODONE HCL 50 MG PO TABS: 50.0000 mg | ORAL_TABLET | Freq: Every day | ORAL | 3 refills | Status: DC

## 2018-11-22 NOTE — Progress Notes (Signed)
Patient ID: [name], female   DOB: [birth year], 43 y.o.   MRN: [id number]   TELEHEALTH VIRTUAL GYNECOLOGY VISIT ENCOUNTER NOTE  I connected with [name] on 11/22/18 at 11:30 AM EDT by telephone at home and verified that I am speaking with the correct person using two identifiers.   I discussed the limitations, risks, security and privacy concerns of performing an evaluation and management service by telephone and the availability of in person appointments. I also discussed with the patient that there may be a patient responsible charge related to this service. The patient expressed understanding and agreed to proceed.   History:  [name] is a 43 y.o. [phone] female being evaluated today for being teary, not sleeping and feeling increased anxiety, feels like meds not right.  She denies being suicidal or homicidal or other concerns.    PCP is [employer].    Past Medical History:  Diagnosis Date  . Degenerative disc disease    L1 and L5 per pt, no meds   Past Surgical History:  Procedure Laterality Date  . c sections    . CESAREAN SECTION     x 3  . CHOLECYSTECTOMY    . DILATION AND CURETTAGE OF UTERUS    . TUBAL LIGATION    . WISDOM TOOTH EXTRACTION     The following portions of the patient's history were reviewed and updated as appropriate: allergies, current medications, past family history, past medical history, past social history, past surgical history and problem list.   Health Maintenance:  Normal pap and negative HRHPV on 10/24/16.  Normal mammogram on 04/23/18  Review of Systems:  Pertinent items noted in HPI and remainder of comprehensive ROS otherwise negative.  Physical Exam:   General:  Alert, oriented and cooperative.   Mental Status: Normal mood and affect perceived. Normal judgment and thought content.  Physical exam deferred due to nature of the encounter Ht 5\' 8"  (1.727 m)   LMP 11/12/2018 (Approximate)   BMI 23.57 kg/m   PHQ 9 score  is 13. Fall risk is low. Will leave lexapro at same dose and increase Buspar and add trazadone.   Labs and Imaging No results found for this or any previous visit (from the past 336 hour(s)). No results found.    Assessment and Plan:     1. Depression, unspecified depression type Will continue lexapro at 20 mg 1 daily and add trazodone 50 mg at bedtime and increase buspar to 7.5 mg tid Meds ordered this encounter  Medications  . busPIRone (BUSPAR) 7.5 MG tablet    Sig: Take 1 tablet (7.5 mg total) by mouth 3 (three) times daily.    Dispense:  90 tablet    Refill:  3    Order Specific Question:   Supervising Provider    Answer:   Despina Hidden, LUTHER H [[id number]]  . escitalopram (LEXAPRO) 20 MG tablet    Sig: TAKE (1) TABLET BY MOUTH ONCE DAILY.    Dispense:  30 tablet    Refill:  6    Order Specific Question:   Supervising Provider    Answer:   Despina Hidden, LUTHER H [[id number]]  . traZODone (DESYREL) 50 MG tablet    Sig: Take 1 tablet (50 mg total) by mouth at bedtime.    Dispense:  30 tablet    Refill:  3    Order Specific Question:   Supervising Provider    Answer:   Duane Lope H [[id number]]  follow up  in for weeks to discuss meds and for pap and physical    2. Anxiety       I discussed the assessment and treatment plan with the patient. The patient was provided an opportunity to ask questions and all were answered. The patient agreed with the plan and demonstrated an understanding of the instructions.   The patient was advised to call back or seek an in-person evaluation/go to the ED if the symptoms worsen or if the condition fails to improve as anticipated.  I provided  10 minutes of non-face-to-face time during this encounter.   Cyril MourningJennifer Griffin, NP [facility]

## 2018-12-07 ENCOUNTER — Telehealth: Payer: Self-pay | Admitting: Adult Health

## 2018-12-07 NOTE — Telephone Encounter (Signed)
Pt thinks Buspar has helped but still not sleeping, so take 2 50 mg trazodone at bedtime and keep appt 6/12 at !2N for physical

## 2018-12-07 NOTE — Telephone Encounter (Signed)
Left message I called, 

## 2018-12-07 NOTE — Telephone Encounter (Signed)
Patient called stating that she would like a call back from Jennifer, Patient did not state the reason for the call. Please contact pt 

## 2018-12-24 ENCOUNTER — Other Ambulatory Visit: Payer: Self-pay | Admitting: Adult Health

## 2019-01-05 ENCOUNTER — Other Ambulatory Visit: Payer: Self-pay | Admitting: Adult Health

## 2019-01-13 ENCOUNTER — Other Ambulatory Visit: Payer: Self-pay

## 2019-01-13 DIAGNOSIS — W228XXA Striking against or struck by other objects, initial encounter: Secondary | ICD-10-CM | POA: Insufficient documentation

## 2019-01-13 DIAGNOSIS — Y9389 Activity, other specified: Secondary | ICD-10-CM | POA: Insufficient documentation

## 2019-01-13 DIAGNOSIS — Y998 Other external cause status: Secondary | ICD-10-CM | POA: Insufficient documentation

## 2019-01-13 DIAGNOSIS — S91312A Laceration without foreign body, left foot, initial encounter: Secondary | ICD-10-CM | POA: Insufficient documentation

## 2019-01-13 DIAGNOSIS — Y92009 Unspecified place in unspecified non-institutional (private) residence as the place of occurrence of the external cause: Secondary | ICD-10-CM | POA: Insufficient documentation

## 2019-01-14 ENCOUNTER — Other Ambulatory Visit: Payer: Self-pay

## 2019-01-14 ENCOUNTER — Encounter (HOSPITAL_COMMUNITY): Payer: Self-pay

## 2019-01-14 ENCOUNTER — Emergency Department (HOSPITAL_COMMUNITY)
Admission: EM | Admit: 2019-01-14 | Discharge: 2019-01-14 | Disposition: A | Discharge status: Home / Self Care | Payer: Self-pay | Attending: Emergency Medicine | Admitting: Emergency Medicine

## 2019-01-14 DIAGNOSIS — S91312A Laceration without foreign body, left foot, initial encounter: Secondary | ICD-10-CM

## 2019-01-14 MED ORDER — LIDOCAINE-EPINEPHRINE (PF) 2 %-1:200000 IJ SOLN
20.0000 mL | Freq: Once | INTRAMUSCULAR | Status: AC
  Administered 2019-01-14: 20 mL via INTRADERMAL
  Filled 2019-01-14: qty 20

## 2019-01-14 NOTE — ED Provider Notes (Signed)
[facility] Provider Note   CSN: [id number] Arrival date & time: 01/13/19  2259    History   Chief Complaint Chief Complaint  Patient presents with  . Laceration    HPI [name] is a 43 y.o. female.     Patient presents to the emergency department for evaluation of laceration on left foot.  Patient reports that she caught the inner aspect of her foot on a piece of metal on her screen door earlier tonight.  She has been trying to stop the bleeding with direct pressure but it kept bleeding so she came to the ER for further evaluation.  Her tetanus was updated 6 months ago.  She does not have any significant pain.     Past Medical History:  Diagnosis Date  . Degenerative disc disease    L1 and L5 per pt, no meds    Patient Active Problem List   Diagnosis Date Noted  . Depression 11/22/2018  . Anxiety 11/22/2018  . 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 Sterilization 11/13/2011    Past Surgical History:  Procedure Laterality Date  . c sections    . CESAREAN SECTION     x 3  . CHOLECYSTECTOMY    . DILATION AND CURETTAGE OF UTERUS    . TUBAL LIGATION    . WISDOM TOOTH EXTRACTION       OB History    Gravida  6   Para  4   Term  4   Preterm  0   AB  2   Living  4     SAB  0   TAB  2   Ectopic  0   Multiple  0   Live Births  1            Home Medications    Prior to Admission medications   Medication Sig Start Date End Date Taking? Authorizing Provider  busPIRone (BUSPAR) 7.5 MG tablet Take 1 tablet (7.5 mg total) by mouth 3 (three) times daily. 11/22/18   Estill Dooms, NP  escitalopram (LEXAPRO) 20 MG tablet TAKE (1) TABLET BY MOUTH ONCE DAILY. 11/22/18   Estill Dooms, NP  LINZESS 145 MCG CAPS capsule TAKE (1) CAPSULE BY MOUTH EVERY DAY. 11/19/17   Estill Dooms, NP  traZODone (DESYREL) 50 MG tablet Take 1 tablet (50 mg total) by mouth at bedtime. 11/22/18   Estill Dooms, NP     Family History Family History  Problem Relation Age of Onset  . Cancer Maternal Grandmother   . Diabetes Maternal Grandmother   . Anesthesia problems Neg Hx     Social History Social History   Tobacco Use  . Smoking status: Never Smoker  . Smokeless tobacco: Never Used  Substance Use Topics  . Alcohol use: No  . Drug use: No     Allergies   Patient has no known allergies.   Review of Systems Review of Systems  Skin: Positive for wound.  Neurological: Negative.      Physical Exam Updated Vital Signs BP 131/88 (BP Location: Left Arm)   Pulse 87   Temp 98.6 F (37 C) (Oral)   Resp 16   Ht 5\' 8"  (1.727 m)   Wt 65.8 kg   SpO2 97%   BMI 22.05 kg/m   Physical Exam Vitals signs and nursing note reviewed.  Constitutional:      Appearance: Normal appearance.  HENT:     Head:  Atraumatic.  Pulmonary:     Effort: Pulmonary effort is normal.  Musculoskeletal: Normal range of motion.     Left foot: Laceration present.       Feet:  Skin:    Findings: Laceration (V-shaped flap laceration medial aspect of left foot at calcaneus region) present.  Neurological:     Mental Status: She is alert.      ED Treatments / Results  Labs (all labs ordered are listed, but only abnormal results are displayed) Labs Reviewed - No data to display  EKG None  Radiology No results found.  Procedures .Marland Kitchen.Laceration Repair  Date/Time: 01/14/2019 1:03 AM Performed by: Gilda CreasePollina, Christopher J, MD Authorized by: Gilda CreasePollina, Christopher J, MD   Consent:    Consent obtained:  Verbal   Consent given by:  Patient   Risks discussed:  Infection, pain and poor cosmetic result Universal protocol:    Procedure explained and questions answered to patient or proxy's satisfaction: yes     Site/side marked: yes     Immediately prior to procedure, a time out was called: yes     Patient identity confirmed:  Verbally with patient Anesthesia (see MAR for exact dosages):    Anesthesia method:   Local infiltration   Local anesthetic:  Lidocaine 2% WITH epi Laceration details:    Location:  Foot   Foot location:  L heel   Length (cm):  2 Repair type:    Repair type:  Simple Pre-procedure details:    Preparation:  Patient was prepped and draped in usual sterile fashion Exploration:    Hemostasis achieved with:  Epinephrine and direct pressure   Wound exploration: wound explored through full range of motion     Wound extent: no muscle damage noted, no nerve damage noted, no tendon damage noted and no vascular damage noted     Contaminated: no   Treatment:    Area cleansed with:  Betadine   Irrigation solution:  Sterile water   Irrigation method:  Syringe Skin repair:    Repair method:  Sutures   Suture size:  3-0   Suture material:  Prolene   Number of sutures:  3 Approximation:    Approximation:  Close Post-procedure details:    Dressing:  Adhesive bandage   Patient tolerance of procedure:  Tolerated well, no immediate complications   (including critical care time)  Medications Ordered in ED Medications  lidocaine-EPINEPHrine (XYLOCAINE W/EPI) 2 %-1:200000 (PF) injection 20 mL (20 mLs Intradermal Given 01/14/19 0032)     Initial Impression / Assessment and Plan / ED Course  I have reviewed the triage vital signs and the nursing notes.  Pertinent labs & imaging results that were available during my care of the patient were reviewed by me and considered in my medical decision making (see chart for details).        Uncomplicated laceration of foot.  Sutures placed, will have removal in 10 days.  Final Clinical Impressions(s) / ED Diagnoses   Final diagnoses:  Laceration of left foot, initial encounter    ED Discharge Orders    None       Pollina, Canary Brimhristopher J, MD 01/14/19 0105

## 2019-01-14 NOTE — ED Triage Notes (Signed)
Pt arrives from home c/o laceration to lateral aspect of left foot after hitting it on a screen door at home. Bleeding controlled at this time. Pt has NAD.

## 2019-02-03 ENCOUNTER — Encounter: Payer: Self-pay | Admitting: Adult Health

## 2019-02-03 ENCOUNTER — Ambulatory Visit (INDEPENDENT_AMBULATORY_CARE_PROVIDER_SITE_OTHER): Payer: PRIVATE HEALTH INSURANCE | Admitting: Adult Health

## 2019-02-03 ENCOUNTER — Other Ambulatory Visit: Payer: Self-pay

## 2019-02-03 VITALS — BP 124/79 | HR 82 | Ht 68.0 in | Wt 189.0 lb

## 2019-02-03 DIAGNOSIS — Z1212 Encounter for screening for malignant neoplasm of rectum: Secondary | ICD-10-CM | POA: Diagnosis not present

## 2019-02-03 DIAGNOSIS — Z01419 Encounter for gynecological examination (general) (routine) without abnormal findings: Secondary | ICD-10-CM | POA: Diagnosis not present

## 2019-02-03 DIAGNOSIS — Z1211 Encounter for screening for malignant neoplasm of colon: Secondary | ICD-10-CM | POA: Diagnosis not present

## 2019-02-03 DIAGNOSIS — F329 Major depressive disorder, single episode, unspecified: Secondary | ICD-10-CM

## 2019-02-03 DIAGNOSIS — F419 Anxiety disorder, unspecified: Secondary | ICD-10-CM

## 2019-02-03 DIAGNOSIS — F32A Depression, unspecified: Secondary | ICD-10-CM

## 2019-02-03 LAB — HEMOCCULT GUIAC POC 1CARD (OFFICE): Fecal Occult Blood, POC: NEGATIVE

## 2019-02-03 MED ORDER — HYDROXYZINE HCL 25 MG PO TABS: ORAL_TABLET | ORAL | 6 refills | Status: DC

## 2019-02-03 MED ORDER — PAROXETINE HCL 20 MG PO TABS: 20.0000 mg | ORAL_TABLET | Freq: Every day | ORAL | 6 refills | Status: DC

## 2019-02-03 MED ORDER — BUSPIRONE HCL 7.5 MG PO TABS: 7.5000 mg | ORAL_TABLET | Freq: Three times a day (TID) | ORAL | 6 refills | Status: DC

## 2019-02-03 NOTE — Progress Notes (Signed)
Patient ID: [name], female   DOB: [birth year], 43 y.o.   MRN: [id number] History of Present Illness: Rhanda is a 43 year old white female, married, [id number] in for a well woman gyn exam, she had a normal pap with negative HPV 10/24/16. She is not sleeping well and is moody PCP is Dr Sallee Lange.   Current Medications, Allergies, Past Medical History, Past Surgical History, Family History and Social History were reviewed in [employer] record.     Review of Systems: Patient denies any headaches, hearing loss, fatigue, blurred vision, shortness of breath, chest pain, abdominal pain, problems with bowel movements(on linzess), urination, or intercourse. No joint pain. +moody  Does not sleep well     Physical Exam:BP 124/79 (BP Location: Left Arm, Patient Position: Sitting, Cuff Size: Normal)   Pulse 82   Ht 5\' 8"  (1.727 m)   Wt 189 lb (85.7 kg)   LMP 01/12/2019 (Approximate)   BMI 28.74 kg/m  General:  Well developed, well nourished, no acute distress Skin:  Warm and dry,tan with multiple tattoos Neck:  Midline trachea, normal thyroid, good ROM, no lymphadenopathy Lungs; Clear to auscultation bilaterally Breast:  No dominant palpable mass, retraction, or nipple discharge Cardiovascular: Regular rate and rhythm Abdomen:  Soft, non tender, no hepatosplenomegaly Pelvic:  External genitalia is normal in appearance, no lesions.  The vagina is normal in appearance. Urethra has no lesions or masses. The cervix is bulbous.  Uterus is felt to be normal size, shape, and contour.  No adnexal masses or tenderness noted.Bladder is non tender, no masses felt. Rectal: Good sphincter tone, no polyps,+ hemorrhoids felt.  Hemoccult negative. Extremities/musculoskeletal:  No swelling or varicosities noted, no clubbing or cyanosis,scab left inner ankle, had stitches  Psych:  No mood changes, alert and cooperative,seems happy Fall risk is low PHQ 9 score is 9, denies being  suicidal, and is on  lexapro and Buspar, but will stop lexapro and try Paxil, and continue Buspar and will stop trazodone and try vistaril at HS. Examination chaperoned by Levy Pupa LPN.  Impression: 1. Encounter for well woman exam with routine gynecological exam   2. Screening for colorectal cancer   3. Anxiety   4. Depression, unspecified depression type       Plan: Stop lexapro and start paxil Continue Buspar Stop trazodone and start vistaril at HS  Meds ordered this encounter  Medications  . PARoxetine (PAXIL) 20 MG tablet    Sig: Take 1 tablet (20 mg total) by mouth daily.    Dispense:  30 tablet    Refill:  6    Order Specific Question:   Supervising Provider    Answer:   Elonda Husky, LUTHER H [[id number]]  . busPIRone (BUSPAR) 7.5 MG tablet    Sig: Take 1 tablet (7.5 mg total) by mouth 3 (three) times daily.    Dispense:  90 tablet    Refill:  6    Order Specific Question:   Supervising Provider    Answer:   Elonda Husky, LUTHER H [[id number]]  . hydrOXYzine (ATARAX/VISTARIL) 25 MG tablet    Sig: Take at HS prn    Dispense:  30 tablet    Refill:  6    Order Specific Question:   Supervising Provider    Answer:   Tania Ade H [[id number]]   Follow up in 3 months Pap and physical in 1 year Mammogram this year Labs with PCP

## 2019-04-26 ENCOUNTER — Telehealth: Payer: Self-pay | Admitting: *Deleted

## 2019-04-26 NOTE — Telephone Encounter (Signed)
Pt returned my call states that she is bleeding for about a month. She has an appt with Anderson Malta on the 23rd, doesn't really want to come in before then. Advised that Anderson Malta can address the bleeding at that time but if it becomes worse or she has any other problems to call us back. Also advised I would route this note to [facility] to give her a heads up.

## 2019-04-26 NOTE — Telephone Encounter (Signed)
Patient left a message that she needs a nurse to call her back. Called patient and left message that I am returning her call.

## 2019-05-06 ENCOUNTER — Ambulatory Visit: Payer: PRIVATE HEALTH INSURANCE | Admitting: Adult Health

## 2019-05-24 ENCOUNTER — Other Ambulatory Visit (HOSPITAL_COMMUNITY): Payer: Self-pay | Admitting: Adult Health

## 2019-05-24 DIAGNOSIS — Z1231 Encounter for screening mammogram for malignant neoplasm of breast: Secondary | ICD-10-CM

## 2019-06-20 ENCOUNTER — Other Ambulatory Visit: Payer: Self-pay

## 2019-06-20 ENCOUNTER — Ambulatory Visit (HOSPITAL_COMMUNITY)
Admission: RE | Admit: 2019-06-20 | Discharge: 2019-06-20 | Disposition: A | Discharge status: Home / Self Care | Payer: PRIVATE HEALTH INSURANCE | Source: Ambulatory Visit | Attending: Adult Health | Admitting: Adult Health

## 2019-06-20 DIAGNOSIS — Z1231 Encounter for screening mammogram for malignant neoplasm of breast: Secondary | ICD-10-CM

## 2019-08-31 ENCOUNTER — Encounter (HOSPITAL_COMMUNITY): Payer: Self-pay

## 2019-08-31 ENCOUNTER — Emergency Department (HOSPITAL_COMMUNITY): Payer: 59

## 2019-08-31 ENCOUNTER — Other Ambulatory Visit: Payer: Self-pay

## 2019-08-31 ENCOUNTER — Emergency Department (HOSPITAL_COMMUNITY)
Admission: EM | Admit: 2019-08-31 | Discharge: 2019-08-31 | Disposition: A | Discharge status: Home / Self Care | Payer: 59 | Attending: Emergency Medicine | Admitting: Emergency Medicine

## 2019-08-31 DIAGNOSIS — Z79899 Other long term (current) drug therapy: Secondary | ICD-10-CM | POA: Insufficient documentation

## 2019-08-31 DIAGNOSIS — Y999 Unspecified external cause status: Secondary | ICD-10-CM | POA: Insufficient documentation

## 2019-08-31 DIAGNOSIS — S80911A Unspecified superficial injury of right knee, initial encounter: Secondary | ICD-10-CM | POA: Diagnosis present

## 2019-08-31 DIAGNOSIS — Y929 Unspecified place or not applicable: Secondary | ICD-10-CM | POA: Insufficient documentation

## 2019-08-31 DIAGNOSIS — W010XXA Fall on same level from slipping, tripping and stumbling without subsequent striking against object, initial encounter: Secondary | ICD-10-CM | POA: Insufficient documentation

## 2019-08-31 DIAGNOSIS — Y939 Activity, unspecified: Secondary | ICD-10-CM | POA: Insufficient documentation

## 2019-08-31 DIAGNOSIS — S82041A Displaced comminuted fracture of right patella, initial encounter for closed fracture: Secondary | ICD-10-CM | POA: Diagnosis not present

## 2019-08-31 MED ORDER — FENTANYL CITRATE (PF) 100 MCG/2ML IJ SOLN
50.0000 ug | Freq: Once | INTRAMUSCULAR | Status: AC
  Administered 2019-08-31: 12:00:00 50 ug via INTRAMUSCULAR
  Filled 2019-08-31: qty 2

## 2019-08-31 MED ORDER — FENTANYL CITRATE (PF) 100 MCG/2ML IJ SOLN
50.0000 ug | Freq: Once | INTRAMUSCULAR | Status: AC
  Administered 2019-08-31: 50 ug via INTRAMUSCULAR
  Filled 2019-08-31: qty 2

## 2019-08-31 MED ORDER — OXYCODONE-ACETAMINOPHEN 5-325 MG PO TABS: 1.0000 | ORAL_TABLET | ORAL | 0 refills | Status: DC | PRN

## 2019-08-31 MED ORDER — ONDANSETRON 8 MG PO TBDP
8.0000 mg | ORAL_TABLET | Freq: Once | ORAL | Status: AC
  Administered 2019-08-31: 8 mg via ORAL
  Filled 2019-08-31: qty 1

## 2019-08-31 NOTE — ED Provider Notes (Signed)
[facility] EMERGENCY DEPARTMENT Provider Note   CSN: [id number] Arrival date & time: 08/31/19  0901     History Chief Complaint  Patient presents with  . Fall    [name] is a 44 y.o. female.  HPI      [name] is a 44 y.o. female who presents to the Emergency Department complaining of pain to the anterior right knee secondary to a mechanical fall that occurred this morning.  She states that she stepped on a patch of ice and fell directly on her right knee.  She complains of pain along her kneecap and inability to bear weight or lift her lower leg up.  There is a deformity noted of the patella.  She denies previous knee injuries.  No therapies prior to arrival.  She denies other injuries including neck or back pain, head injury, or LOC.  She denies pain of the right hip or ankle   Past Medical History:  Diagnosis Date  . Degenerative disc disease    L1 and L5 per pt, no meds    Patient Active Problem List   Diagnosis Date Noted  . Screening for colorectal cancer 02/03/2019  . Encounter for well woman exam with routine gynecological exam 02/03/2019  . Depression 11/22/2018  . Anxiety 11/22/2018  . 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 Sterilization 11/13/2011    Past Surgical History:  Procedure Laterality Date  . c sections    . CESAREAN SECTION     x 3  . CHOLECYSTECTOMY    . DILATION AND CURETTAGE OF UTERUS    . TUBAL LIGATION    . WISDOM TOOTH EXTRACTION       OB History    Gravida  6   Para  4   Term  4   Preterm  0   AB  2   Living  4     SAB  0   TAB  2   Ectopic  0   Multiple  0   Live Births  1           Family History  Problem Relation Age of Onset  . Cancer Maternal Grandmother   . Diabetes Maternal Grandmother   . Anesthesia problems Neg Hx     Social History   Tobacco Use  . Smoking status: Never Smoker  . Smokeless tobacco: Never Used  Substance Use Topics  . Alcohol use: No  . Drug  use: No    Home Medications Prior to Admission medications   Medication Sig Start Date End Date Taking? Authorizing Provider  busPIRone (BUSPAR) 7.5 MG tablet Take 1 tablet (7.5 mg total) by mouth 3 (three) times daily. 02/03/19   Adline Potter, NP  hydrOXYzine (ATARAX/VISTARIL) 25 MG tablet Take at HS prn 02/03/19   Adline Potter, NP  LINZESS 145 MCG CAPS capsule TAKE (1) CAPSULE BY MOUTH EVERY DAY. 11/19/17   Adline Potter, NP  PARoxetine (PAXIL) 20 MG tablet Take 1 tablet (20 mg total) by mouth daily. 02/03/19   Adline Potter, NP    Allergies    Patient has no known allergies.  Review of Systems   Review of Systems  Constitutional: Negative for chills and fever.  Cardiovascular: Negative for chest pain.  Gastrointestinal: Negative for abdominal pain, nausea and vomiting.  Musculoskeletal: Positive for arthralgias (right knee pain). Negative for back pain, joint swelling and neck pain.  Skin: Negative for color change and wound.  Neurological: Negative for dizziness, weakness, numbness and headaches.    Physical Exam Updated Vital Signs BP 118/65 (BP Location: Right Arm)   Pulse 80   Temp 98.4 F (36.9 C) (Oral)   Resp 20   Ht 5\' 8"  (1.727 m)   Wt 70.3 kg   LMP 08/15/2019   SpO2 97%   BMI 23.57 kg/m   Physical Exam Vitals and nursing note reviewed.  Constitutional:      General: She is not in acute distress.    Appearance: She is well-developed.     Comments: Pt is uncomfortable appearing  HENT:     Head: Atraumatic.  Cardiovascular:     Rate and Rhythm: Normal rate and regular rhythm.     Pulses: Normal pulses.  Pulmonary:     Effort: Pulmonary effort is normal.     Breath sounds: Normal breath sounds.  Musculoskeletal:        General: Tenderness, deformity and signs of injury present.     Cervical back: Normal range of motion. No tenderness.     Comments: Tenderness to palpation of the anterior right knee with step-off deformity noted at  the patella tendon area.  Patella appears to be high rising. Pt unable to perform SLR.  No palpable effusion, no ecchymosis.  Compartments are soft.   Skin:    General: Skin is warm.     Capillary Refill: Capillary refill takes less than 2 seconds.     Findings: No bruising, erythema or rash.  Neurological:     General: No focal deficit present.     Mental Status: She is alert.     Sensory: No sensory deficit.     Motor: No weakness or abnormal muscle tone.     Coordination: Coordination normal.     ED Results / Procedures / Treatments   Labs (all labs ordered are listed, but only abnormal results are displayed) Labs Reviewed - No data to display  EKG None  Radiology DG Knee Complete 4 Views Right  Result Date: 08/31/2019 CLINICAL DATA:  Fall, right knee pain EXAM: RIGHT KNEE - COMPLETE 4+ VIEW COMPARISON:  None. FINDINGS: There is a comminuted patellar fracture. Fracture fragments are displaced/distracted 2.3 cm. No significant joint effusion. No additional acute bony abnormality. IMPRESSION: Displaced/distracted patellar fracture. Electronically Signed   By: Rolm Baptise M.D.   On: 08/31/2019 11:00    Procedures Procedures (including critical care time)  Medications Ordered in ED Medications  ondansetron (ZOFRAN-ODT) disintegrating tablet 8 mg (8 mg Oral Given 08/31/19 1001)  fentaNYL (SUBLIMAZE) injection 50 mcg (50 mcg Intramuscular Given 08/31/19 1001)  fentaNYL (SUBLIMAZE) injection 50 mcg (50 mcg Intramuscular Given 08/31/19 1216)    ED Course  I have reviewed the triage vital signs and the nursing notes.  Pertinent labs & imaging results that were available during my care of the patient were reviewed by me and considered in my medical decision making (see chart for details).    MDM Rules/Calculators/A&P                     Pt with mechanical fall landing on her anterior right knee.  Exam concerning for patella fx vs tendon injury.     1030  On recheck, pain  improved after medications given.  Awaiting XR.    1120  Consulted orthopedics, Dr. Percell Miller and discussed findings.  He recommends knee immobilizer and he will see her in his office today  SPLINT APPLICATION Date/Time: 78:46 PM Authorized  by: Pauline Aus Consent: Verbal consent obtained. Risks and benefits: risks, benefits and alternatives were discussed Consent given by: patient Splint applied by: myself and nursing Location details: right knee Splint type: knee immobilizer Supplies used: Knee immobilizer brace Post-procedure: The splinted body part was neurovascularly unchanged following the procedure. Patient tolerance: Patient tolerated the procedure well with no immediate complications.    Final Clinical Impression(s) / ED Diagnoses Final diagnoses:  Closed displaced comminuted fracture of right patella, initial encounter    Rx / DC Orders ED Discharge Orders    None       Pauline Aus, PA-C 08/31/19 1236    Sabas Sous, MD 09/01/19 [phone]

## 2019-08-31 NOTE — Discharge Instructions (Addendum)
Keep the knee immobilizer in place.  You may bear weight as tolerated with the knee immobilizer on.  I have discussed your XR results with Dr. Eulah Pont and he would like to see you in his office today between the hours of 2 and 5 PM.

## 2019-08-31 NOTE — ED Triage Notes (Signed)
Pt reports she fell on an icy patch in the road.  C/O pain to r knee.

## 2019-09-01 ENCOUNTER — Other Ambulatory Visit: Payer: Self-pay

## 2019-09-01 ENCOUNTER — Encounter (HOSPITAL_BASED_OUTPATIENT_CLINIC_OR_DEPARTMENT_OTHER): Payer: Self-pay | Admitting: Orthopedic Surgery

## 2019-09-01 NOTE — Progress Notes (Signed)
Spoke w/ via phone for pre-op interview--- PT Lab needs dos---- Urine preg              Lab results------ no COVID test ------ 09-02-2019 @AP  Arrive at ------  0530 NPO after ------ Medications to take morning of surgery ----- Buspar, Paxil, Linzess w/ sips of water and if needed take oxycodone/ zofran Diabetic medication ----- n/a Patient Special Instructions ----- n/a Pre-Op special Istructions ----- n/a Patient verbalized understanding of instructions that were given at this phone interview. Patient denies shortness of breath, chest pain, fever, cough a this phone interview.

## 2019-09-02 ENCOUNTER — Other Ambulatory Visit (HOSPITAL_COMMUNITY)
Admission: RE | Admit: 2019-09-02 | Discharge: 2019-09-02 | Disposition: A | Discharge status: Home / Self Care | Payer: 59 | Source: Ambulatory Visit | Attending: Orthopedic Surgery | Admitting: Orthopedic Surgery

## 2019-09-02 DIAGNOSIS — Z01812 Encounter for preprocedural laboratory examination: Secondary | ICD-10-CM | POA: Diagnosis present

## 2019-09-02 DIAGNOSIS — Z20822 Contact with and (suspected) exposure to covid-19: Secondary | ICD-10-CM | POA: Insufficient documentation

## 2019-09-02 LAB — SARS CORONAVIRUS 2 (TAT 6-24 HRS): SARS Coronavirus 2: NEGATIVE

## 2019-09-02 NOTE — H&P (Signed)
[facility] [address]   [place] [address] [phone] A Division [facility]  RE: [name], [name]                                  [id number]         DOB: [birth year] 08/31/2019  Reason for visit:  Referral from [facility] ER today with right patella fracture.    HPI: She slipped on the ice today, 08/31/2019.  Pain is relatively well controlled.  She was unable to lift her leg after this.  She has been in a knee immobilizer.    OBJECTIVE: The patient is a well appearing female, in no apparent distress.  Obvious effusion and swelling on the anterior aspect of the knee.  No abrasions to the skin.    IMAGES: X-rays show displaced comminuted patella fracture.    ASSESSMENT/PLAN:  Displaced patella fracture.  She is going to need open reduction and internal fixation of this to be able to walk.  I discussed the risks and benefits of it.  She would like to go forward with this procedure.     Jewel Baize.  Eulah Pont, M.D.  Electronically verified by Jewel Baize. Eulah Pont, M.D. TDM:pmw D 08/31/19 T 09/02/19

## 2019-09-05 NOTE — Anesthesia Preprocedure Evaluation (Addendum)
Anesthesia Evaluation  Patient identified by MRN, date of birth, ID band Patient awake    Reviewed: Allergy & Precautions, H&P , NPO status , Patient's Chart, lab work & pertinent test results  Airway Mallampati: II  TM Distance: >3 FB Neck ROM: full    Dental  (+) Chipped,    Pulmonary neg pulmonary ROS,    Pulmonary exam normal breath sounds clear to auscultation       Cardiovascular negative cardio ROS   Rhythm:regular Rate:Normal     Neuro/Psych PSYCHIATRIC DISORDERS Anxiety Depression negative neurological ROS     GI/Hepatic negative GI ROS, Neg liver ROS,   Endo/Other  negative endocrine ROS  Renal/GU negative Renal ROS     Musculoskeletal  (+) Arthritis , Osteoarthritis,  DDD   Abdominal   Peds  Hematology negative hematology ROS (+)   Anesthesia Other Findings Right patella fx  Reproductive/Obstetrics S/p BTL                          Anesthesia Physical  Anesthesia Plan  ASA: II  Anesthesia Plan: Regional and General   Post-op Pain Management: GA combined w/ Regional for post-op pain   Induction: Intravenous  PONV Risk Score and Plan: 3 and Ondansetron, Dexamethasone, Treatment may vary due to age or medical condition and Midazolam  Airway Management Planned: LMA  Additional Equipment: None  Intra-op Plan:   Post-operative Plan: Extubation in OR  Informed Consent: I have reviewed the patients History and Physical, chart, labs and discussed the procedure including the risks, benefits and alternatives for the proposed anesthesia with the patient or authorized representative who has indicated his/her understanding and acceptance.     Dental advisory given  Plan Discussed with: CRNA  Anesthesia Plan Comments:      Anesthesia Quick Evaluation

## 2019-09-06 ENCOUNTER — Encounter (HOSPITAL_BASED_OUTPATIENT_CLINIC_OR_DEPARTMENT_OTHER): Payer: Self-pay | Admitting: Orthopedic Surgery

## 2019-09-06 ENCOUNTER — Other Ambulatory Visit: Payer: Self-pay

## 2019-09-06 ENCOUNTER — Ambulatory Visit (HOSPITAL_BASED_OUTPATIENT_CLINIC_OR_DEPARTMENT_OTHER): Payer: 59 | Admitting: Anesthesiology

## 2019-09-06 ENCOUNTER — Ambulatory Visit (HOSPITAL_BASED_OUTPATIENT_CLINIC_OR_DEPARTMENT_OTHER)
Admission: RE | Admit: 2019-09-06 | Discharge: 2019-09-06 | Disposition: A | Discharge status: Home / Self Care | Payer: 59 | Attending: Orthopedic Surgery | Admitting: Orthopedic Surgery

## 2019-09-06 ENCOUNTER — Encounter (HOSPITAL_BASED_OUTPATIENT_CLINIC_OR_DEPARTMENT_OTHER)
Admission: RE | Disposition: A | Discharge status: Home / Self Care | Payer: Self-pay | Source: Home / Self Care | Attending: Orthopedic Surgery

## 2019-09-06 DIAGNOSIS — W000XXA Fall on same level due to ice and snow, initial encounter: Secondary | ICD-10-CM | POA: Diagnosis not present

## 2019-09-06 DIAGNOSIS — F419 Anxiety disorder, unspecified: Secondary | ICD-10-CM | POA: Insufficient documentation

## 2019-09-06 DIAGNOSIS — S82044A Nondisplaced comminuted fracture of right patella, initial encounter for closed fracture: Secondary | ICD-10-CM

## 2019-09-06 DIAGNOSIS — S82041A Displaced comminuted fracture of right patella, initial encounter for closed fracture: Secondary | ICD-10-CM | POA: Insufficient documentation

## 2019-09-06 DIAGNOSIS — S82001A Unspecified fracture of right patella, initial encounter for closed fracture: Secondary | ICD-10-CM | POA: Diagnosis present

## 2019-09-06 DIAGNOSIS — M1990 Unspecified osteoarthritis, unspecified site: Secondary | ICD-10-CM | POA: Diagnosis not present

## 2019-09-06 DIAGNOSIS — F329 Major depressive disorder, single episode, unspecified: Secondary | ICD-10-CM | POA: Diagnosis not present

## 2019-09-06 HISTORY — DX: Unspecified fracture of right patella, initial encounter for closed fracture: S82.001A

## 2019-09-06 HISTORY — DX: Presence of spectacles and contact lenses: Z97.3

## 2019-09-06 HISTORY — DX: Other complications of anesthesia, initial encounter: T88.59XA

## 2019-09-06 HISTORY — PX: ORIF PATELLA: SHX5033

## 2019-09-06 HISTORY — DX: Anxiety disorder, unspecified: F41.9

## 2019-09-06 HISTORY — DX: Other constipation: K59.09

## 2019-09-06 HISTORY — DX: Depression, unspecified: F32.A

## 2019-09-06 LAB — POCT PREGNANCY, URINE: Preg Test, Ur: NEGATIVE

## 2019-09-06 SURGERY — OPEN REDUCTION INTERNAL FIXATION (ORIF) PATELLA
Anesthesia: Regional | Site: Knee | Laterality: Right

## 2019-09-06 MED ORDER — SCOPOLAMINE 1 MG/3DAYS TD PT72
MEDICATED_PATCH | TRANSDERMAL | Status: AC
  Filled 2019-09-06: qty 1

## 2019-09-06 MED ORDER — CELECOXIB 200 MG PO CAPS
ORAL_CAPSULE | ORAL | Status: AC
  Filled 2019-09-06: qty 1

## 2019-09-06 MED ORDER — ACETAMINOPHEN 500 MG PO TABS
ORAL_TABLET | ORAL | Status: AC
  Filled 2019-09-06: qty 2

## 2019-09-06 MED ORDER — ACETAMINOPHEN 500 MG PO TABS
1000.0000 mg | ORAL_TABLET | Freq: Once | ORAL | Status: AC
  Administered 2019-09-06: 1000 mg via ORAL
  Filled 2019-09-06: qty 2

## 2019-09-06 MED ORDER — FENTANYL CITRATE (PF) 100 MCG/2ML IJ SOLN
25.0000 ug | INTRAMUSCULAR | Status: DC | PRN
  Administered 2019-09-06: 50 ug via INTRAVENOUS
  Filled 2019-09-06: qty 1

## 2019-09-06 MED ORDER — CHLORHEXIDINE GLUCONATE 4 % EX LIQD
60.0000 mL | Freq: Once | CUTANEOUS | Status: DC
  Filled 2019-09-06: qty 118

## 2019-09-06 MED ORDER — ACETAMINOPHEN 500 MG PO TABS
1000.0000 mg | ORAL_TABLET | Freq: Once | ORAL | Status: DC
  Filled 2019-09-06: qty 2

## 2019-09-06 MED ORDER — ENSURE PRE-SURGERY PO LIQD
296.0000 mL | Freq: Once | ORAL | Status: DC
  Filled 2019-09-06: qty 296

## 2019-09-06 MED ORDER — SCOPOLAMINE 1 MG/3DAYS TD PT72
1.0000 | MEDICATED_PATCH | TRANSDERMAL | Status: DC
  Administered 2019-09-06: 1.5 mg via TRANSDERMAL
  Filled 2019-09-06: qty 1

## 2019-09-06 MED ORDER — OXYCODONE HCL 5 MG PO TABS
5.0000 mg | ORAL_TABLET | Freq: Once | ORAL | Status: DC | PRN
  Filled 2019-09-06: qty 1

## 2019-09-06 MED ORDER — ACETAMINOPHEN 500 MG PO TABS: 1000.0000 mg | ORAL_TABLET | Freq: Three times a day (TID) | ORAL | 0 refills | Status: AC

## 2019-09-06 MED ORDER — METHOCARBAMOL 750 MG PO TABS: 750.0000 mg | ORAL_TABLET | Freq: Three times a day (TID) | ORAL | 0 refills | Status: DC | PRN

## 2019-09-06 MED ORDER — ONDANSETRON HCL 4 MG PO TABS: 4.0000 mg | ORAL_TABLET | Freq: Three times a day (TID) | ORAL | 0 refills | Status: DC | PRN

## 2019-09-06 MED ORDER — ONDANSETRON HCL 4 MG/2ML IJ SOLN
INTRAMUSCULAR | Status: DC | PRN
  Administered 2019-09-06: 4 mg via INTRAVENOUS

## 2019-09-06 MED ORDER — CELECOXIB 200 MG PO CAPS
200.0000 mg | ORAL_CAPSULE | Freq: Once | ORAL | Status: AC
  Administered 2019-09-06: 07:00:00 200 mg via ORAL
  Filled 2019-09-06: qty 1

## 2019-09-06 MED ORDER — OXYCODONE HCL 5 MG PO TABS: 5.0000 mg | ORAL_TABLET | ORAL | 0 refills | Status: AC | PRN

## 2019-09-06 MED ORDER — HYDROMORPHONE HCL 1 MG/ML IJ SOLN
0.2500 mg | INTRAMUSCULAR | Status: DC | PRN
  Filled 2019-09-06: qty 0.5

## 2019-09-06 MED ORDER — ASPIRIN EC 81 MG PO TBEC: 81.0000 mg | DELAYED_RELEASE_TABLET | Freq: Two times a day (BID) | ORAL | 0 refills | Status: DC

## 2019-09-06 MED ORDER — CEFAZOLIN SODIUM-DEXTROSE 2-4 GM/100ML-% IV SOLN
INTRAVENOUS | Status: AC
  Filled 2019-09-06: qty 100

## 2019-09-06 MED ORDER — LIDOCAINE 2% (20 MG/ML) 5 ML SYRINGE
INTRAMUSCULAR | Status: DC | PRN
  Administered 2019-09-06: 40 mg via INTRAVENOUS
  Administered 2019-09-06: 60 mg via INTRAVENOUS

## 2019-09-06 MED ORDER — GABAPENTIN 300 MG PO CAPS
300.0000 mg | ORAL_CAPSULE | Freq: Once | ORAL | Status: AC
  Administered 2019-09-06: 300 mg via ORAL
  Filled 2019-09-06: qty 1

## 2019-09-06 MED ORDER — GABAPENTIN 300 MG PO CAPS
ORAL_CAPSULE | ORAL | Status: AC
  Filled 2019-09-06: qty 1

## 2019-09-06 MED ORDER — LACTATED RINGERS IV SOLN
INTRAVENOUS | Status: DC
  Filled 2019-09-06: qty 1000

## 2019-09-06 MED ORDER — ROPIVACAINE HCL 5 MG/ML IJ SOLN
INTRAMUSCULAR | Status: DC | PRN
  Administered 2019-09-06: 30 mL via PERINEURAL

## 2019-09-06 MED ORDER — MEPERIDINE HCL 25 MG/ML IJ SOLN
6.2500 mg | INTRAMUSCULAR | Status: DC | PRN
  Filled 2019-09-06: qty 1

## 2019-09-06 MED ORDER — FENTANYL CITRATE (PF) 100 MCG/2ML IJ SOLN
INTRAMUSCULAR | Status: AC
  Filled 2019-09-06: qty 2

## 2019-09-06 MED ORDER — LIDOCAINE 2% (20 MG/ML) 5 ML SYRINGE
INTRAMUSCULAR | Status: AC
  Filled 2019-09-06: qty 5

## 2019-09-06 MED ORDER — OXYCODONE HCL 5 MG/5ML PO SOLN
5.0000 mg | Freq: Once | ORAL | Status: DC | PRN
  Filled 2019-09-06: qty 5

## 2019-09-06 MED ORDER — PROPOFOL 10 MG/ML IV BOLUS
INTRAVENOUS | Status: AC
  Filled 2019-09-06: qty 40

## 2019-09-06 MED ORDER — ARTIFICIAL TEARS OPHTHALMIC OINT
TOPICAL_OINTMENT | OPHTHALMIC | Status: AC
  Filled 2019-09-06: qty 3.5

## 2019-09-06 MED ORDER — PROPOFOL 10 MG/ML IV BOLUS
INTRAVENOUS | Status: DC | PRN
  Administered 2019-09-06: 20 mg via INTRAVENOUS
  Administered 2019-09-06: 200 mg via INTRAVENOUS
  Administered 2019-09-06: 30 mg via INTRAVENOUS

## 2019-09-06 MED ORDER — MIDAZOLAM HCL 2 MG/2ML IJ SOLN
INTRAMUSCULAR | Status: AC
  Filled 2019-09-06: qty 2

## 2019-09-06 MED ORDER — MIDAZOLAM HCL 2 MG/2ML IJ SOLN
2.0000 mg | Freq: Once | INTRAMUSCULAR | Status: AC
  Administered 2019-09-06: 07:00:00 2 mg via INTRAVENOUS
  Filled 2019-09-06: qty 2

## 2019-09-06 MED ORDER — MIDAZOLAM HCL 2 MG/2ML IJ SOLN
2.0000 mg | Freq: Once | INTRAMUSCULAR | Status: DC
  Filled 2019-09-06: qty 2

## 2019-09-06 MED ORDER — DEXAMETHASONE SODIUM PHOSPHATE 10 MG/ML IJ SOLN
INTRAMUSCULAR | Status: AC
  Filled 2019-09-06: qty 1

## 2019-09-06 MED ORDER — FENTANYL CITRATE (PF) 100 MCG/2ML IJ SOLN
INTRAMUSCULAR | Status: DC | PRN
  Administered 2019-09-06 (×2): 50 ug via INTRAVENOUS

## 2019-09-06 MED ORDER — CELECOXIB 200 MG PO CAPS: 200.0000 mg | ORAL_CAPSULE | Freq: Two times a day (BID) | ORAL | 0 refills | Status: AC

## 2019-09-06 MED ORDER — PROMETHAZINE HCL 25 MG/ML IJ SOLN
6.2500 mg | INTRAMUSCULAR | Status: DC | PRN
  Filled 2019-09-06: qty 1

## 2019-09-06 MED ORDER — BUPIVACAINE HCL (PF) 0.5 % IJ SOLN
INTRAMUSCULAR | Status: DC | PRN
  Administered 2019-09-06: 9 mL

## 2019-09-06 MED ORDER — DEXAMETHASONE SODIUM PHOSPHATE 10 MG/ML IJ SOLN
INTRAMUSCULAR | Status: DC | PRN
  Administered 2019-09-06: 10 mg via INTRAVENOUS

## 2019-09-06 MED ORDER — ONDANSETRON HCL 4 MG/2ML IJ SOLN
INTRAMUSCULAR | Status: AC
  Filled 2019-09-06: qty 2

## 2019-09-06 MED ORDER — CEFAZOLIN SODIUM-DEXTROSE 2-4 GM/100ML-% IV SOLN
2.0000 g | INTRAVENOUS | Status: AC
  Administered 2019-09-06: 2 g via INTRAVENOUS
  Filled 2019-09-06: qty 100

## 2019-09-06 SURGICAL SUPPLY — 70 items
BANDAGE ESMARK 6X9 LF (GAUZE/BANDAGES/DRESSINGS) ×1 IMPLANT
BIT DRILL 2.6 CANN (BIT) ×1 IMPLANT
BLADE SURG 15 STRL LF DISP TIS (BLADE) ×2 IMPLANT
BLADE SURG 15 STRL SS (BLADE) ×2
BNDG COHESIVE 4X5 TAN STRL (GAUZE/BANDAGES/DRESSINGS) IMPLANT
BNDG ELASTIC 4X5.8 VLCR STR LF (GAUZE/BANDAGES/DRESSINGS) ×2 IMPLANT
BNDG ELASTIC 6X5.8 VLCR STR LF (GAUZE/BANDAGES/DRESSINGS) ×1 IMPLANT
BNDG ESMARK 6X9 LF (GAUZE/BANDAGES/DRESSINGS) ×4
CHLORAPREP W/TINT 26 (MISCELLANEOUS) ×2 IMPLANT
COVER WAND RF STERILE (DRAPES) IMPLANT
CUFF TOURN SGL QUICK 24 (TOURNIQUET CUFF)
CUFF TOURN SGL QUICK 34 (TOURNIQUET CUFF)
CUFF TRNQT CYL 24X4X16.5-23 (TOURNIQUET CUFF) IMPLANT
CUFF TRNQT CYL 34X4.125X (TOURNIQUET CUFF) IMPLANT
DECANTER SPIKE VIAL GLASS SM (MISCELLANEOUS) IMPLANT
DRAPE C-ARM 42X72 X-RAY (DRAPES) ×2 IMPLANT
DRAPE C-ARMOR (DRAPES) ×2 IMPLANT
DRAPE EXTREMITY T 121X128X90 (DISPOSABLE) ×2 IMPLANT
DRAPE OEC MINIVIEW 54X84 (DRAPES) ×2 IMPLANT
DRAPE U-SHAPE 47X51 STRL (DRAPES) IMPLANT
DRSG EMULSION OIL 3X3 NADH (GAUZE/BANDAGES/DRESSINGS) IMPLANT
ELECT REM PT RETURN 9FT ADLT (ELECTROSURGICAL) ×2
ELECTRODE REM PT RTRN 9FT ADLT (ELECTROSURGICAL) ×1 IMPLANT
FIBERTAPE 2 W/STRL NDL 17 (SUTURE) ×1 IMPLANT
GAUZE SPONGE 4X4 12PLY STRL (GAUZE/BANDAGES/DRESSINGS) ×2 IMPLANT
GLOVE BIO SURGEON STRL SZ7.5 (GLOVE) ×4 IMPLANT
GLOVE BIOGEL PI IND STRL 8 (GLOVE) ×2 IMPLANT
GLOVE BIOGEL PI INDICATOR 8 (GLOVE) ×2
GOWN STRL REUS W/ TWL LRG LVL3 (GOWN DISPOSABLE) ×4 IMPLANT
GOWN STRL REUS W/ TWL XL LVL3 (GOWN DISPOSABLE) ×1 IMPLANT
GOWN STRL REUS W/TWL LRG LVL3 (GOWN DISPOSABLE) ×4
GOWN STRL REUS W/TWL XL LVL3 (GOWN DISPOSABLE) ×1
GUIDEWIRE 1.35MM  DUAL TROCAR (WIRE) ×3
GUIDEWIRE 1.35MM DUAL TROCAR (WIRE) IMPLANT
IMMOBILIZER KNEE 22 UNIV (SOFTGOODS) IMPLANT
IMMOBILIZER KNEE 24 THIGH 36 (MISCELLANEOUS) IMPLANT
IMMOBILIZER KNEE 24 UNIV (MISCELLANEOUS) ×2
NEEDLE HYPO 22GX1.5 SAFETY (NEEDLE) IMPLANT
NS IRRIG 1000ML POUR BTL (IV SOLUTION) ×2 IMPLANT
PACK ARTHROSCOPY DSU (CUSTOM PROCEDURE TRAY) IMPLANT
PACK BASIN DAY SURGERY FS (CUSTOM PROCEDURE TRAY) ×2 IMPLANT
PAD ABD 8X10 STRL (GAUZE/BANDAGES/DRESSINGS) IMPLANT
PAD CAST 4YDX4 CTTN HI CHSV (CAST SUPPLIES) ×1 IMPLANT
PADDING CAST COTTON 4X4 STRL (CAST SUPPLIES) ×1
PADDING CAST COTTON 6X4 STRL (CAST SUPPLIES) ×1 IMPLANT
PENCIL BUTTON HOLSTER BLD 10FT (ELECTRODE) ×2 IMPLANT
SCREW CANN BLUNT TIP 4X38 LP (Screw) ×2 IMPLANT
SLEEVE SCD COMPRESS KNEE MED (MISCELLANEOUS) IMPLANT
SPLINT FAST PLASTER 5X30 (CAST SUPPLIES)
SPLINT PLASTER CAST FAST 5X30 (CAST SUPPLIES) IMPLANT
SPONGE LAP 4X18 RFD (DISPOSABLE) ×2 IMPLANT
STRIP CLOSURE SKIN 1/2X4 (GAUZE/BANDAGES/DRESSINGS) ×1 IMPLANT
STRIP CLOSURE SKIN 1/4X4 (GAUZE/BANDAGES/DRESSINGS) ×2 IMPLANT
SUCTION FRAZIER HANDLE 10FR (MISCELLANEOUS)
SUCTION TUBE FRAZIER 10FR DISP (MISCELLANEOUS) IMPLANT
SUT ETHILON 3 0 PS 1 (SUTURE) ×2 IMPLANT
SUT MON AB 2-0 CT1 36 (SUTURE) ×2 IMPLANT
SUT MON AB 4-0 PC3 18 (SUTURE) ×2 IMPLANT
SUT VIC AB 0 CT1 27 (SUTURE) ×3
SUT VIC AB 0 CT1 27XBRD ANBCTR (SUTURE) IMPLANT
SUT VIC AB 0 SH 27 (SUTURE) IMPLANT
SUT VIC AB 2-0 SH 27 (SUTURE) ×1
SUT VIC AB 2-0 SH 27XBRD (SUTURE) IMPLANT
SYR BULB 3OZ (MISCELLANEOUS) ×2 IMPLANT
TOWEL OR NON WOVEN STRL DISP B (DISPOSABLE) ×2 IMPLANT
TUBE CONNECTING 12X1/4 (SUCTIONS) IMPLANT
UNDERPAD 30X30 (UNDERPADS AND DIAPERS) ×2 IMPLANT
WASHER (Orthopedic Implant) ×1 IMPLANT
WASHER ORTHO 7X (Orthopedic Implant) IMPLANT
YANKAUER SUCT BULB TIP NO VENT (SUCTIONS) ×2 IMPLANT

## 2019-09-06 NOTE — Transfer of Care (Signed)
    Last Vitals:  Vitals Value Taken Time  BP    Temp    Pulse 68 09/06/19 0902  Resp 14 09/06/19 0902  SpO2 100 % 09/06/19 0902  Vitals shown include unvalidated device data.  Last Pain:  Vitals:   09/06/19 0611  TempSrc: Oral  PainSc: 5       Patients Stated Pain Goal: 5 (09/06/19 0746)  Immediate Anesthesia Transfer of Care Note  Patient: [name]  Procedure(s) Performed: Procedure(s) (LRB): OPEN REDUCTION INTERNAL (ORIF) FIXATION PATELLA (Right)  Patient Location: PACU  Anesthesia Type: General  Level of Consciousness: awake, alert  and oriented  Airway & Oxygen Therapy: Patient Spontanous Breathing and Patient connected to nasal cannula oxygen  Post-op Assessment: Report given to PACU RN and Post -op Vital signs reviewed and stable  Post vital signs: Reviewed and stable  Complications: No apparent anesthesia complications

## 2019-09-06 NOTE — Discharge Instructions (Signed)
Post Anesthesia Home Care Instructions  Activity: Get plenty of rest for the remainder of the day. A responsible adult should stay with you for 24 hours following the procedure.  For the next 24 hours, DO NOT: -Drive a car -Paediatric nurse -Drink alcoholic beverages -Take any medication unless instructed by your physician -Make any legal decisions or sign important papers.  Meals: Start with liquid foods such as gelatin or soup. Progress to regular foods as tolerated. Avoid greasy, spicy, heavy foods. If nausea and/or vomiting occur, drink only clear liquids until the nausea and/or vomiting subsides. Call your physician if vomiting continues.  Special Instructions/Symptoms: Your throat may feel dry or sore from the anesthesia or the breathing tube placed in your throat during surgery. If this causes discomfort, gargle with warm salt water. The discomfort should disappear within 24 hours.  If you had a scopolamine patch placed behind your ear for the management of post- operative nausea and/or vomiting:  1. The medication in the patch is effective for 72 hours, after which it should be removed.  Wrap patch in a tissue and discard in the trash. Wash hands thoroughly with soap and water. 2. You may remove the patch earlier than 72 hours if you experience unpleasant side effects which may include dry mouth, dizziness or visual disturbances. 3. Avoid touching the patch. Wash your hands with soap and water after contact with the patch.   Regional Anesthesia Blocks  1. Numbness or the inability to move the "blocked" extremity may last from 3-48 hours after placement. The length of time depends on the medication injected and your individual response to the medication. If the numbness is not going away after 48 hours, call your surgeon.  2. The extremity that is blocked will need to be protected until the numbness is gone and the  Strength has returned. Because you cannot feel it, you will need  to take extra care to avoid injury. Because it may be weak, you may have difficulty moving it or using it. You may not know what position it is in without looking at it while the block is in effect.  3. For blocks in the legs and feet, returning to weight bearing and walking needs to be done carefully. You will need to wait until the numbness is entirely gone and the strength has returned. You should be able to move your leg and foot normally before you try and bear weight or walk. You will need someone to be with you when you first try to ensure you do not fall and possibly risk injury.  4. Bruising and tenderness at the needle site are common side effects and will resolve in a few days.  5. Persistent numbness or new problems with movement should be communicated to the surgeon or the [place] [phone] [place] [phone]).Elevate leg and apply ice to reduce pain and swelling. Stop as needed pain medication as soon as you are able.  Maintain knee immobilizer at all times until follow up.  Diet: As you were doing prior to hospitalization   Dressing:  Keep dressings on and dry until follow up.  Activity:  Knee immobilizer full time.  Increase activity slowly as tolerated, but follow the weight bearing instructions below.  The rules on driving is that you can not be taking narcotics while you drive, and you must feel in control of the vehicle.    Weight Bearing:   Touch down weight bearing until follow up.  Must wear knee immobilizer and keep leg fully extended at all times.  To prevent constipation: you may use a stool softener such as -  Colace (over the counter) 100 mg by mouth twice a day  Drink plenty of fluids (prune juice may be helpful) and high fiber foods Miralax (over the counter) for constipation as needed.    Itching:  If you experience itching with your medications, try taking only a single pain pill, or even half a pain pill at a time.   You can also use benadryl over the counter for itching or also to help with sleep.   Precautions:  If you experience chest pain or shortness of breath - call 911 immediately for transfer to the hospital emergency department!!  If you develop a fever greater that 101 F, purulent drainage from wound, increased redness or drainage from wound, or calf pain -- Call the office at [phone]                                                 Follow- Up Appointment:  Please call for an appointment to be seen in 2 weeks [place] - [phone]

## 2019-09-06 NOTE — Anesthesia Procedure Notes (Signed)
Anesthesia Regional Block: Femoral nerve block   Pre-Anesthetic Checklist: ,, timeout performed, Correct Patient, Correct Site, Correct Laterality, Correct Procedure,, site marked, risks and benefits discussed, Surgical consent,  Pre-op evaluation,  At surgeon's request and post-op pain management  Laterality: Right  Prep: chloraprep       Needles:  Injection technique: Single-shot  Needle Type: Echogenic Stimulator Needle     Needle Length: 9cm  Needle Gauge: 21     Additional Needles:   Procedures:,,,, ultrasound used (permanent image in chart),,,,  Narrative:  Start time: 09/06/2019 7:10 AM End time: 09/06/2019 7:20 AM Injection made incrementally with aspirations every 5 mL.  Performed by: Personally  Anesthesiologist: Leonides Grills, MD  Additional Notes: Functioning IV was confirmed and monitors were applied. A time-out was performed. Hand hygiene and sterile gloves were used. The thigh was placed in a frog-leg position and prepped in a sterile fashion. A 42mm 21ga Arrow echogenic stimulator needle was placed using ultrasound guidance.  Negative aspiration and negative test dose prior to incremental administration of local anesthetic. The patient tolerated the procedure well.

## 2019-09-06 NOTE — Progress Notes (Signed)
Assisted Dr. Bradley Ferris with ultrasound guided, femoral block. Side rails up, monitors on throughout procedure. See vital signs in flow sheet. Tolerated Procedure well.

## 2019-09-06 NOTE — Anesthesia Procedure Notes (Signed)
Procedure Name: LMA Insertion Date/Time: 09/06/2019 7:40 AM Performed by: Norva Pavlov, CRNA Pre-anesthesia Checklist: Patient identified, Emergency Drugs available, Suction available and Patient being monitored Patient Re-evaluated:Patient Re-evaluated prior to induction Oxygen Delivery Method: Circle system utilized Preoxygenation: Pre-oxygenation with 100% oxygen Induction Type: IV induction Ventilation: Mask ventilation without difficulty LMA: LMA inserted LMA Size: 4.0 Number of attempts: 1 Airway Equipment and Method: Bite block Placement Confirmation: positive ETCO2 Tube secured with: Tape Dental Injury: Teeth and Oropharynx as per pre-operative assessment

## 2019-09-06 NOTE — Interval H&P Note (Signed)
I participated in the care of this patient and agree with the above history, physical and evaluation. I performed a review of the history and a physical exam as detailed   Luccia Reinheimer Daniel Korin Setzler MD  

## 2019-09-06 NOTE — Anesthesia Postprocedure Evaluation (Signed)
Anesthesia Post Note  Patient: [name]  Procedure(s) Performed: OPEN REDUCTION INTERNAL (ORIF) FIXATION PATELLA (Right Knee)     Patient location during evaluation: PACU Anesthesia Type: Regional and General Level of consciousness: awake and alert Pain management: pain level controlled Vital Signs Assessment: post-procedure vital signs reviewed and stable Respiratory status: spontaneous breathing, nonlabored ventilation, respiratory function stable and patient connected to nasal cannula oxygen Cardiovascular status: blood pressure returned to baseline and stable Postop Assessment: no apparent nausea or vomiting Anesthetic complications: no    Last Vitals:  Vitals:   09/06/19 1016 09/06/19 1100  BP: 119/69 123/79  Pulse: 74 75  Resp: 14 16  Temp:  36.7 C  SpO2: 100% 95%    Last Pain:  Vitals:   09/06/19 1042  TempSrc:   PainSc: 2                  [name]

## 2019-09-06 NOTE — Op Note (Signed)
09/06/2019  11:18 AM  PATIENT:  [name]    PRE-OPERATIVE DIAGNOSIS:  RIGHT PATELLA FRACTURE  POST-OPERATIVE DIAGNOSIS:  Same  PROCEDURE:  OPEN REDUCTION INTERNAL (ORIF) FIXATION PATELLA  SURGEON:  Sheral Apley, MD  PHYSICIAN ASSISTANT: Aquilla Hacker, PA-C, he was present and scrubbed throughout the case, critical for completion in a timely fashion, and for retraction, instrumentation, and closure.   ANESTHESIA:   General  PREOPERATIVE INDICATIONS:  [name] is a  44 y.o. female with a diagnosis of RIGHT PATELLA FRACTURE who elected for surgical management in order to restore the function of the extensor mechanism.    The risks benefits and alternatives were discussed with the patient preoperatively including but not limited to the risks of infection, bleeding, nerve injury, cardiopulmonary complications, the need for revision surgery, hardware prominence, hardware failure, the need for hardware removal, nonunion, malunion, posttraumatic arthritis, stiffness, loss of strength and function, among others, and the patient was willing to proceed.  OPERATIVE IMPLANTS: 4.0 mm cannulated screws x2 with a total of 2 #2 FiberWire going through the cannulated screws in a figure-of-eight cerclage fashion  OPERATIVE FINDINGS: Displaced patella fracture  OPERATIVE PROCEDURE: The patient was brought to the operating room and placed in the supine position. General anesthesia was administered. IV antibiotics were given. The lower extremity was prepped and draped in usual sterile fashion. The leg was elevated and exsanguinated and the tourniquet was inflated. Time out was performed.   Anterior incision was made over the patella and the fracture fragments identified and cleaned of hematoma. The retinaculum was torn on either side.  I reduced the fracture anatomically and held provisionally with a clamp and placed 2 guidewires for the cannulated screws.  The lengths were measured,  after being confirmed on C-arm, and then I placed the screws, taking care to make sure that there were threads only on the proximal segment, providing compression at the fracture site, and the tips were not prominent proximally.  C-arm used to confirm reduction and position of the screws, and once I was satisfied with this I then used a Keith needle through the screws bringing a total of 2 #2 FiberWire in a figure-of-eight type fashion. This provided excellent secondary fixation. I left the screw lengths slightly short of the far cortex in order to minimize the risk for rupture of the FiberWire over the tip of the screws.  The wounds were irrigated copiously.  I performed a repair of the collateral joint capsul and extensor retinaculum that had ruptured on the medial side. I was happy with this closure.   I repaired the collateral joint capsul and retinaculum on the lateral side. I was happy with this apposition as well  I used Vicryl for the subcutaneous tissue with Steri-Strips and sterile gauze for the skin. The wounds were also injected. A knee immobilizer was applied. The patient was awakened and returned to the PACU in stable and satisfactory condition. There were no complications.   POSTOPERATIVE PLAN: WBAT in knee immobilizer, DVT px: ASA and mobilization

## 2019-10-18 ENCOUNTER — Ambulatory Visit (HOSPITAL_COMMUNITY): Payer: 59 | Attending: Orthopedic Surgery | Admitting: Physical Therapy

## 2019-10-18 ENCOUNTER — Other Ambulatory Visit: Payer: Self-pay

## 2019-10-18 ENCOUNTER — Encounter (HOSPITAL_COMMUNITY): Payer: Self-pay | Admitting: Physical Therapy

## 2019-10-18 DIAGNOSIS — M25661 Stiffness of right knee, not elsewhere classified: Secondary | ICD-10-CM | POA: Insufficient documentation

## 2019-10-18 DIAGNOSIS — M6281 Muscle weakness (generalized): Secondary | ICD-10-CM | POA: Insufficient documentation

## 2019-10-18 DIAGNOSIS — R2689 Other abnormalities of gait and mobility: Secondary | ICD-10-CM | POA: Diagnosis present

## 2019-10-18 NOTE — Therapy (Signed)
[facility] [address] [place], [place], [address] Phone: [phone]   Fax:  [phone]  Physical Therapy Evaluation  Patient Details  Name: [name] MRN: [id number] Date of Birth: 08-29-1975 Referring Provider (PT): Margarita Rana, MD   Encounter Date: 10/18/2019  PT End of Session - 10/18/19 1607    Visit Number  1    Number of Visits  19    Date for PT Re-Evaluation  11/29/19    Authorization Type  [employer] (30 visit limit)    Authorization Time Period  10/18/19 - 11/29/19    Authorization - Visit Number  1    Authorization - Number of Visits  30    Progress Note Due on Visit  10    PT Start Time  1437    PT Stop Time  1515    PT Time Calculation (min)  38 min    Equipment Utilized During Treatment  Right knee immobilizer    Activity Tolerance  Patient tolerated treatment well    Behavior During Therapy  [facility] for tasks assessed/performed       Past Medical History:  Diagnosis Date  . Anxiety   . Chronic constipation   . Complication of anesthesia    PONV  . Degenerative disc disease    L1 and L5 per pt, no meds  . Depression   . Right patella fracture    fell 08-31-2019  . Wears glasses     Past Surgical History:  Procedure Laterality Date  . CESAREAN SECTION     x 3  . CHOLECYSTECTOMY    . DILATION AND CURETTAGE OF UTERUS    . ORIF PATELLA Right 09/06/2019   Procedure: OPEN REDUCTION INTERNAL (ORIF) FIXATION PATELLA;  Surgeon: Sheral Apley, MD;  Location: [facility] [place];  Service: Orthopedics;  Laterality: Right;  . TUBAL LIGATION    . WISDOM TOOTH EXTRACTION      There were no vitals filed for this visit.   Subjective Assessment - 10/18/19 1443    Subjective  Patient reported that she fell on ice and broke her patella which occurred 4-5 days prior to her surgery which was an ORIF to repair her patella on 09/06/19. Patient is currently in a hinged brace when WB. Patient said she was  educated to bend her knee as much as possible when laying down. She stated that currently the brace is locked at 20 degrees of flexion. Patient reported having walked 3 miles everyday prior to injury. Reported having just purchased a [employer] prior to injury and is especially interested in being able to use this.    Pertinent History  S/P RT patellar ORIF 09/06/19    Limitations  Walking;House hold activities    How long can you sit comfortably?  Not able to bend as much as usual, but can sit    How long can you stand comfortably?  10 minutes    How long can you walk comfortably?  30 minutes    Patient Stated Goals  Get back to normal and walking better    Currently in Pain?  No/denies         [facility] PT Assessment - 10/18/19 0001      Assessment   Medical Diagnosis  ORIF RT Patella & capsular repair    Referring Provider (PT)  Margarita Rana, MD    Onset Date/Surgical Date  09/06/19    Next MD Visit  --   In  about 6 weeks from 10/17/19   Prior Therapy  None      Precautions   Precautions  Knee    Required Braces or Orthoses  Knee Immobilizer - Right    Knee Immobilizer - Right  On when out of bed or walking   Locked at 20 degrees     Restrictions   Weight Bearing Restrictions  Yes    RLE Weight Bearing  Weight bearing as tolerated      Balance Screen   Has the patient fallen in the past 6 months  Yes    How many times?  1    Has the patient had a decrease in activity level because of a fear of falling?   No    Is the patient reluctant to leave their home because of a fear of falling?   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      Cognition   Overall Cognitive Status  Within Functional Limits for tasks assessed      Observation/Other Assessments   Observations  Incision appears well healed    Focus on Therapeutic Outcomes (FOTO)   51%      Observation/Other Assessments-Edema    Edema  Circumferential      Circumferential Edema   Circumferential - Right  44 cm at knee joint line    Circumferential - Left   41.5 cm at knee joint line      Sensation   Light Touch  Appears Intact      ROM / Strength   AROM / PROM / Strength  AROM;Strength      AROM   AROM Assessment Site  Knee    Right/Left Knee  Right;Left    Right Knee Extension  0    Right Knee Flexion  82    Left Knee Extension  0    Left Knee Flexion  136      Strength   Overall Strength Comments  SLR RT LE slight quad lag    Strength Assessment Site  Hip;Knee;Ankle    Right/Left Hip  Right;Left    Right Hip Flexion  4-/5    Left Hip Flexion  5/5    Right/Left Knee  Right;Left    Right Knee Flexion  4+/5    Right Knee Extension  4/5    Left Knee Flexion  5/5    Left Knee Extension  5/5    Right/Left Ankle  Right;Left    Right Ankle Dorsiflexion  5/5    Left Ankle Dorsiflexion  5/5      Palpation   Patella mobility  RT patella hypomobile superior/inferior direction      Ambulation/Gait   Ambulation/Gait  Yes    Ambulation Distance (Feet)  505 Feet     Gait Pattern  Right circumduction;Decreased hip/knee flexion -  right    Ambulation Surface  Level;Indoor                Objective measurements completed on examination: See above findings.              PT Education - 10/18/19 1607    Education Details  Patient was educated on examination findings, POC, and initial HEP.    Person(s) Educated  Patient    Methods  Handout    Comprehension   Verbalized understanding       PT Short Term Goals - 10/18/19 1542      PT SHORT TERM GOAL #1   Title  Patient will report understanding and regular compliance with HEP to improve ROM, strength, and overall functional mobility.    Time  3    Period  Weeks    Status  New    Target Date  11/08/19      PT SHORT TERM GOAL #2   Title  Patient will demonstrate right knee flexion active range of motion of at least 95 degrees to assist with more normalized gait pattern and stair ambulation.    Time  3    Period  Weeks    Target Date  11/08/19        PT Long Term Goals - 10/18/19 1544      PT LONG TERM GOAL #1   Title  Patient will demonstrate right knee flexion active range of motion of at least 120 degrees to assist with squatting and stair ambulation.    Time  6    Period  Weeks    Status  New    Target Date  11/29/19      PT LONG TERM GOAL #2   Title  Patient will demonstrate ability to ambulate with normalized knee flexion and without circumduction of the right lower extremity during .    Time  6    Period  Weeks    Status  New    Target Date  11/29/19      PT LONG TERM GOAL #3   Title  Patient will demonstrate ability to perform a straight leg raise on the RT LE without any signs of quad lag.    Time  6    Period  Weeks    Status  New    Target Date  11/29/19      PT LONG TERM GOAL #4   Title  Patient will demonstrate improvement of at least 10% on the FOTO test indicating improved percieved functional mobility.    Time  6    Period  Weeks    Status  New    Target Date  11/29/19             Plan - 10/18/19 1637    Clinical Impression Statement  Patient is a 44 year old female who presented to outpatient physical therapy S/P RT patellar ORIF on 09/06/19. She is currently in a locked hinge brace at 20 degrees. She demonstrated decreased right knee flexion AROM, decreased RT LE strength. Patient with increased knee edema in the right knee as well as noted  increased gait deviations with . Patient would benefit from continued skilled physical therapy to address the abovementioned deficits and help patient return to PLOF.    Personal Factors and Comorbidities  Comorbidity 2    Comorbidities  Depression, Anxiety    Examination-Activity Limitations  Stand;Lift;Locomotion Level;Bend;Transfers;Squat;Stairs    Examination-Participation Restrictions  Yard Work;Cleaning;Community Activity  Stability/Clinical Decision Making  Stable/Uncomplicated    Clinical Decision Making  Low    Rehab Potential  Good    PT Frequency  3x / week    PT Duration  6 weeks    PT Treatment/Interventions  ADLs/Self Care Home Management;Aquatic Therapy;Cryotherapy;Electrical Stimulation;Moist Heat;DME Instruction;Gait training;Stair training;Functional mobility training;Therapeutic activities;Therapeutic exercise;Balance training;Neuromuscular re-education;Patient/family education;Orthotic Fit/Training;Manual techniques;Scar mobilization;Passive range of motion;Dry needling;Energy conservation;Taping    PT Next Visit Plan  Focus on increasing RT knee flexion AROM, decreasing edema, review HEP and goals.    PT Home Exercise Plan  10/18/19: quad set, heel slide    Consulted and Agree with Plan of Care  Patient       Patient will benefit from skilled therapeutic intervention in order to improve the following deficits and impairments:  Abnormal gait, Improper body mechanics, Decreased mobility, Decreased activity tolerance, Decreased endurance, Decreased range of motion, Decreased strength, Hypomobility, Difficulty walking, Impaired flexibility  Visit Diagnosis: Stiffness of right knee, not elsewhere classified  Muscle weakness (generalized)  Other abnormalities of gait and mobility     Problem List Patient Active Problem List   Diagnosis Date Noted  . Screening for colorectal cancer 02/03/2019  . Encounter for well woman exam with routine gynecological exam  02/03/2019  . Depression 11/22/2018  . Anxiety 11/22/2018  . 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 Sterilization 11/13/2011   Clarene Critchley PT, DPT 4:45 PM, 10/18/19 [place], [place], [address] Phone: [phone]   Fax:  [phone]  Name: [name] MRN: [id number] Date of Birth: 1975-11-19

## 2019-10-18 NOTE — Patient Instructions (Addendum)
Quad Set    With other leg bent, foot flat, slowly tighten muscles on thigh of straight leg while counting out loud to _5___.  Repeat _15___ times. Do __2-3__ sessions per day.  http://gt2.exer.us/276   Copyright  VHI. All rights reserved.  Heel Slide    Bend knee and pull heel toward buttocks. Hold _5___ seconds. Return.  Repeat _15___ times. Do _2-3___ sessions per day.  http://gt2.exer.us/372   Copyright  VHI. All rights reserved.

## 2019-10-20 ENCOUNTER — Ambulatory Visit (HOSPITAL_COMMUNITY): Payer: 59 | Admitting: Physical Therapy

## 2019-10-20 ENCOUNTER — Telehealth (HOSPITAL_COMMUNITY): Payer: Self-pay | Admitting: Physical Therapy

## 2019-10-20 NOTE — Telephone Encounter (Signed)
pt daughter is sick so she cancelled her appt today to be with her

## 2019-10-24 ENCOUNTER — Other Ambulatory Visit: Payer: Self-pay

## 2019-10-24 ENCOUNTER — Ambulatory Visit (HOSPITAL_COMMUNITY): Payer: 59 | Admitting: Physical Therapy

## 2019-10-24 DIAGNOSIS — M25661 Stiffness of right knee, not elsewhere classified: Secondary | ICD-10-CM

## 2019-10-24 DIAGNOSIS — R2689 Other abnormalities of gait and mobility: Secondary | ICD-10-CM

## 2019-10-24 DIAGNOSIS — M6281 Muscle weakness (generalized): Secondary | ICD-10-CM

## 2019-10-24 NOTE — Therapy (Signed)
[facility] [address] [place], [place], [address] Phone: [phone]   Fax:  [phone]  Physical Therapy Treatment  Patient Details  Name: [name] MRN: [id number] Date of Birth: 1975/12/03 Referring Provider (PT): Margarita Rana, MD   Encounter Date: 10/24/2019  PT End of Session - 10/24/19 1719    Visit Number  2    Number of Visits  19    Date for PT Re-Evaluation  11/29/19    Authorization Type  [employer] (30 visit limit)    Authorization Time Period  10/18/19 - 11/29/19    Authorization - Visit Number  2    Authorization - Number of Visits  30    Progress Note Due on Visit  10    PT Start Time  1535    PT Stop Time  1615    PT Time Calculation (min)  40 min    Equipment Utilized During Treatment  Right knee immobilizer    Activity Tolerance  Patient tolerated treatment well    Behavior During Therapy  [facility] for tasks assessed/performed       Past Medical History:  Diagnosis Date  . Anxiety   . Chronic constipation   . Complication of anesthesia    PONV  . Degenerative disc disease    L1 and L5 per pt, no meds  . Depression   . Right patella fracture    fell 08-31-2019  . Wears glasses     Past Surgical History:  Procedure Laterality Date  . CESAREAN SECTION     x 3  . CHOLECYSTECTOMY    . DILATION AND CURETTAGE OF UTERUS    . ORIF PATELLA Right 09/06/2019   Procedure: OPEN REDUCTION INTERNAL (ORIF) FIXATION PATELLA;  Surgeon: Sheral Apley, MD;  Location: [facility];  Service: Orthopedics;  Laterality: Right;  . TUBAL LIGATION    . WISDOM TOOTH EXTRACTION      There were no vitals filed for this visit.  Subjective Assessment - 10/24/19 1541    Subjective  pt states she went to the beach over the weekend and walked on the beach.    Currently in Pain?  No/denies                       [facility] Adult PT Treatment/Exercise - 10/24/19 0001      Knee/Hip Exercises:  Standing   Heel Raises  15 reps    Heel Raises Limitations  toeraises 15 reps     Forward Lunges  Both;10 reps;Limitations    Forward Lunges Limitations  onto 4" step no UE's    Lateral Step Up  Right;10 reps;Step Height: 4";Hand Hold: 1    Forward Step Up  Right;10 reps;Hand Hold: 1;Step Height: 4"    Step Down  Right;10 reps;Hand Hold: 1;Step Height: 4"    Stairs  7" stairs reciprocally with 1 HR    SLS  30" each without difficulty    Gait Training  in department without brace, heel to toe gait 200 feet       Knee/Hip Exercises: Supine   Heel Slides  Right;10 reps    Straight Leg Raises  Right;15 reps    Knee Extension  AROM;Right    Knee Extension Limitations  0    Knee Flexion  AROM;Right    Knee Flexion Limitations  100      Knee/Hip Exercises: Sidelying   Hip ABduction  Right;15 reps  Knee/Hip Exercises: Prone   Hamstring Curl  15 reps;Limitations    Hamstring Curl Limitations  right               PT Short Term Goals - 10/18/19 1542      PT SHORT TERM GOAL #1   Title  Patient will report understanding and regular compliance with HEP to improve ROM, strength, and overall functional mobility.    Time  3    Period  Weeks    Status  New    Target Date  11/08/19      PT SHORT TERM GOAL #2   Title  Patient will demonstrate right knee flexion active range of motion of at least 95 degrees to assist with more normalized gait pattern and stair ambulation.    Time  3    Period  Weeks    Target Date  11/08/19        PT Long Term Goals - 10/18/19 1544      PT LONG TERM GOAL #1   Title  Patient will demonstrate right knee flexion active range of motion of at least 120 degrees to assist with squatting and stair ambulation.    Time  6    Period  Weeks    Status  New    Target Date  11/29/19      PT LONG TERM GOAL #2   Title  Patient will demonstrate ability to ambulate with normalized knee flexion and without circumduction of the right lower extremity during  .    Time  6    Period  Weeks    Status  New    Target Date  11/29/19      PT LONG TERM GOAL #3   Title  Patient will demonstrate ability to perform a straight leg raise on the RT LE without any signs of quad lag.    Time  6    Period  Weeks    Status  New    Target Date  11/29/19      PT LONG TERM GOAL #4   Title  Patient will demonstrate improvement of at least 10% on the FOTO test indicating improved percieved functional mobility.    Time  6    Period  Weeks    Status  New    Target Date  11/29/19            Plan - 10/24/19 1720    Clinical Impression Statement  Pt doing excellent overall. Admits to not wearing brace while at the beach.  Pt with very mild gait deviation, however close to normal and with no pain when ambulating without brace.  Will speak to evaluating therapist regarding brace; PT is able to complete SLR without extension lag and progressed to standing functional exercises this session.  Pt without any difficulty with added exercises this session or pain.  AROM increased this session to 100 degrees flexion.    Personal Factors and Comorbidities  Comorbidity 2    Comorbidities  Depression, Anxiety    Examination-Activity Limitations  Stand;Lift;Locomotion Level;Bend;Transfers;Squat;Stairs    Examination-Participation Restrictions  Yard Work;Cleaning;Community Activity    Stability/Clinical Decision Making  Stable/Uncomplicated    Rehab Potential  Good    PT Frequency  3x / week    PT Duration  6 weeks    PT Treatment/Interventions  ADLs/Self Care Home Management;Aquatic Therapy;Cryotherapy;Electrical Stimulation;Moist Heat;DME Instruction;Gait training;Stair training;Functional mobility training;Therapeutic activities;Therapeutic exercise;Balance training;Neuromuscular re-education;Patient/family education;Orthotic Fit/Training;Manual techniques;Scar mobilization;Passive range of motion;Dry needling;Energy conservation;Taping  PT Next Visit Plan  Focus on  increasing RT knee flexion AROM, decreasing edema, review HEP and goals.  Speak to PT regarding request to MD to lift brace restrictions due to quad strength WNL.    PT Home Exercise Plan  10/18/19: quad set, heel slide    Consulted and Agree with Plan of Care  Patient       Patient will benefit from skilled therapeutic intervention in order to improve the following deficits and impairments:  Abnormal gait, Improper body mechanics, Decreased mobility, Decreased activity tolerance, Decreased endurance, Decreased range of motion, Decreased strength, Hypomobility, Difficulty walking, Impaired flexibility  Visit Diagnosis: Stiffness of right knee, not elsewhere classified  Muscle weakness (generalized)  Other abnormalities of gait and mobility     Problem List Patient Active Problem List   Diagnosis Date Noted  . Screening for colorectal cancer 02/03/2019  . Encounter for well woman exam with routine gynecological exam 02/03/2019  . Depression 11/22/2018  . Anxiety 11/22/2018  . 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 Sterilization 11/13/2011   Teena Irani, PTA/CLT [phone]  Teena Irani 10/24/2019, 5:24 PM  [facility] [address] [place], [place], [address] Phone: [phone]   Fax:  [phone]  Name: RAMISA DUMAN MRN: [id number] Date of Birth: 06-19-1976

## 2019-10-26 ENCOUNTER — Other Ambulatory Visit: Payer: Self-pay

## 2019-10-26 ENCOUNTER — Ambulatory Visit (HOSPITAL_COMMUNITY): Payer: 59 | Admitting: Physical Therapy

## 2019-10-26 DIAGNOSIS — M25661 Stiffness of right knee, not elsewhere classified: Secondary | ICD-10-CM | POA: Diagnosis not present

## 2019-10-26 DIAGNOSIS — M6281 Muscle weakness (generalized): Secondary | ICD-10-CM

## 2019-10-26 DIAGNOSIS — R2689 Other abnormalities of gait and mobility: Secondary | ICD-10-CM

## 2019-10-26 NOTE — Therapy (Signed)
[facility] [place], [place], [address] Phone: [phone]   Fax:  [phone]  Physical Therapy Treatment  Patient Details  Name: [name] MRN: [id number] Date of Birth: August 05, 1975 Referring Provider (PT): Edmonia Lynch, MD   Encounter Date: 10/26/2019  PT End of Session - 10/26/19 1653    Visit Number  3    Number of Visits  19    Date for PT Re-Evaluation  11/29/19    Authorization Type  [employer] (30 visit limit)    Authorization Time Period  10/18/19 - 11/29/19    Authorization - Visit Number  3    Authorization - Number of Visits  30    Progress Note Due on Visit  10    PT Start Time  1620    PT Stop Time  1700    PT Time Calculation (min)  40 min    Equipment Utilized During Treatment  Right knee immobilizer    Activity Tolerance  Patient tolerated treatment well    Behavior During Therapy  [facility] for tasks assessed/performed       Past Medical History:  Diagnosis Date  . Anxiety   . Chronic constipation   . Complication of anesthesia    PONV  . Degenerative disc disease    L1 and L5 per pt, no meds  . Depression   . Right patella fracture    fell 08-31-2019  . Wears glasses     Past Surgical History:  Procedure Laterality Date  . CESAREAN SECTION     x 3  . CHOLECYSTECTOMY    . DILATION AND CURETTAGE OF UTERUS    . ORIF PATELLA Right 09/06/2019   Procedure: OPEN REDUCTION INTERNAL (ORIF) FIXATION PATELLA;  Surgeon: Renette Butters, MD;  Location: [facility];  Service: Orthopedics;  Laterality: Right;  . TUBAL LIGATION    . WISDOM TOOTH EXTRACTION      There were no vitals filed for this visit.  Subjective Assessment - 10/26/19 1620    Subjective  pt returns today without any issues.  STates she's doing her exercises.    Currently in Pain?  No/denies                       [facility] Adult PT Treatment/Exercise - 10/26/19 0001      Knee/Hip Exercises:  Standing   Heel Raises  15 reps    Heel Raises Limitations  toeraises 15 reps     Forward Lunges  Both;15 reps    Forward Lunges Limitations  onto 4" step no UE's    Lateral Step Up  Right;10 reps;Step Height: 4";Hand Hold: 1    Forward Step Up  Right;10 reps;Hand Hold: 1;Step Height: 4"    Step Down  Right;10 reps;Hand Hold: 1;Step Height: 4"    Stairs  7" stairs reciprocally with 1 HR    SLS with Vectors  bilateral wit 1 UE assist 10X5" each    Gait Training  in department without brace, heel to toe gait 200 feet                PT Short Term Goals - 10/18/19 1542      PT SHORT TERM GOAL #1   Title  Patient will report understanding and regular compliance with HEP to improve ROM, strength, and overall functional mobility.    Time  3    Period  Weeks    Status  New    Target Date  11/08/19      PT SHORT TERM GOAL #2   Title  Patient will demonstrate right knee flexion active range of motion of at least 95 degrees to assist with more normalized gait pattern and stair ambulation.    Time  3    Period  Weeks    Target Date  11/08/19        PT Long Term Goals - 10/18/19 1544      PT LONG TERM GOAL #1   Title  Patient will demonstrate right knee flexion active range of motion of at least 120 degrees to assist with squatting and stair ambulation.    Time  6    Period  Weeks    Status  New    Target Date  11/29/19      PT LONG TERM GOAL #2   Title  Patient will demonstrate ability to ambulate with normalized knee flexion and without circumduction of the right lower extremity during .    Time  6    Period  Weeks    Status  New    Target Date  11/29/19      PT LONG TERM GOAL #3   Title  Patient will demonstrate ability to perform a straight leg raise on the RT LE without any signs of quad lag.    Time  6    Period  Weeks    Status  New    Target Date  11/29/19      PT LONG TERM GOAL #4   Title  Patient will demonstrate improvement of at least 10% on the FOTO  test indicating improved percieved functional mobility.    Time  6    Period  Weeks    Status  New    Target Date  11/29/19            Plan - 10/26/19 1741    Clinical Impression Statement  Informed pateint that evaluating therapist had reached out to MD and OK to wean off brace.  Adjusted to 90 degrees so she can fully flex her knee when on..   Progressed with stability and balance exercises with continued strengthening as well.  pt able to complete all exercises without any pain.  Most challenging is step downs as her eccentric strength is weak.  Pt without any gait deviations today when ambulating without brace.    Personal Factors and Comorbidities  Comorbidity 2    Comorbidities  Depression, Anxiety    Examination-Activity Limitations  Stand;Lift;Locomotion Level;Bend;Transfers;Squat;Stairs    Examination-Participation Restrictions  Yard Work;Cleaning;Community Activity    Stability/Clinical Decision Making  Stable/Uncomplicated    Rehab Potential  Good    PT Frequency  3x / week    PT Duration  6 weeks    PT Treatment/Interventions  ADLs/Self Care Home Management;Aquatic Therapy;Cryotherapy;Electrical Stimulation;Moist Heat;DME Instruction;Gait training;Stair training;Functional mobility training;Therapeutic activities;Therapeutic exercise;Balance training;Neuromuscular re-education;Patient/family education;Orthotic Fit/Training;Manual techniques;Scar mobilization;Passive range of motion;Dry needling;Energy conservation;Taping    PT Next Visit Plan  Focus on increasing RT knee flexion AROM, strength and stability.  Measure flexion next session.    PT Home Exercise Plan  10/18/19: quad set, heel slide    Consulted and Agree with Plan of Care  Patient       Patient will benefit from skilled therapeutic intervention in order to improve the following deficits and impairments:  Abnormal gait, Improper body mechanics, Decreased mobility, Decreased activity tolerance, Decreased endurance,  Decreased range of motion, Decreased  strength, Hypomobility, Difficulty walking, Impaired flexibility  Visit Diagnosis: Stiffness of right knee, not elsewhere classified  Muscle weakness (generalized)  Other abnormalities of gait and mobility     Problem List Patient Active Problem List   Diagnosis Date Noted  . Screening for colorectal cancer 02/03/2019  . Encounter for well woman exam with routine gynecological exam 02/03/2019  . Depression 11/22/2018  . Anxiety 11/22/2018  . 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 Sterilization 11/13/2011   Lurena Nida, PTA/CLT [phone]  Lurena Nida 10/26/2019, 5:51 PM  [facility] [address] [place], [place], [address] Phone: [phone]   Fax:  [phone]  Name: [name] MRN: [id number] Date of Birth: January 15, 1976

## 2019-10-31 ENCOUNTER — Telehealth (HOSPITAL_COMMUNITY): Payer: Self-pay | Admitting: Physical Therapy

## 2019-10-31 ENCOUNTER — Ambulatory Visit (HOSPITAL_COMMUNITY): Payer: 59 | Admitting: Physical Therapy

## 2019-10-31 NOTE — Telephone Encounter (Signed)
Pt l/m to cx today she is still out of town

## 2019-11-02 ENCOUNTER — Other Ambulatory Visit: Payer: Self-pay

## 2019-11-02 ENCOUNTER — Ambulatory Visit (HOSPITAL_COMMUNITY): Payer: 59 | Admitting: Physical Therapy

## 2019-11-02 DIAGNOSIS — R2689 Other abnormalities of gait and mobility: Secondary | ICD-10-CM

## 2019-11-02 DIAGNOSIS — M25661 Stiffness of right knee, not elsewhere classified: Secondary | ICD-10-CM

## 2019-11-02 DIAGNOSIS — M6281 Muscle weakness (generalized): Secondary | ICD-10-CM

## 2019-11-02 NOTE — Therapy (Signed)
[place], [place], [address] Phone: [phone]   Fax:  [phone]  Physical Therapy Treatment  Patient Details  Name: [name] MRN: [id number] Date of Birth: [birth year] Referring Provider (PT): Edmonia Lynch, MD   Encounter Date: 11/02/2019  PT End of Session - 11/02/19 1602    Visit Number  4    Number of Visits  19    Date for PT Re-Evaluation  11/29/19    Authorization Type  [employer] (30 visit limit)    Authorization Time Period  10/18/19 - 11/29/19    Authorization - Visit Number  4    Authorization - Number of Visits  30    Progress Note Due on Visit  10    PT Start Time  1408    PT Stop Time  1500    PT Time Calculation (min)  52 min    Equipment Utilized During Treatment  Right knee immobilizer    Activity Tolerance  Patient tolerated treatment well    Behavior During Therapy  [facility] for tasks assessed/performed       Past Medical History:  Diagnosis Date  . Anxiety   . Chronic constipation   . Complication of anesthesia    PONV  . Degenerative disc disease    L1 and L5 per pt, no meds  . Depression   . Right patella fracture    fell 08-31-2019  . Wears glasses     Past Surgical History:  Procedure Laterality Date  . CESAREAN SECTION     x 3  . CHOLECYSTECTOMY    . DILATION AND CURETTAGE OF UTERUS    . ORIF PATELLA Right 09/06/2019   Procedure: OPEN REDUCTION INTERNAL (ORIF) FIXATION PATELLA;  Surgeon: Renette Butters, MD;  Location: [facility];  Service: Orthopedics;  Laterality: Right;  . TUBAL LIGATION    . WISDOM TOOTH EXTRACTION      There were no vitals filed for this visit.  Subjective Assessment - 11/02/19 1413    Subjective  pt states she is doing well today, some tightness and mm twitching above her knee when she gets still.  No pain currently.  States she started walking the [employer] this week.                        [facility] Adult PT Treatment/Exercise - 11/02/19 0001      Knee/Hip Exercises: Machines for Strengthening   Cybex Leg Press  40# 2X10      Knee/Hip Exercises: Standing   Heel Raises  20 reps    Heel Raises Limitations  toeraises 20 reps     Forward Lunges  Both;20 reps    Forward Lunges Limitations  onto floor    Lateral Step Up  Right;Step Height: 4";Hand Hold: 1;15 reps    Forward Step Up  Right;Hand Hold: 1;Step Height: 4";15 reps    Step Down  Right;Hand Hold: 1;Step Height: 4";15 reps    Stairs  7" stairs reciprocally with 1 HR    SLS with Vectors  bilateral wit 1 UE assist 10X5" each      Knee/Hip Exercises: Supine   Knee Extension  AROM;Right    Knee Extension Limitations  0    Knee Flexion  AROM;Right    Knee Flexion Limitations  125   was 100 last week     Manual Therapy   Manual Therapy  Myofascial release  Manual therapy comments  completed at [facility] seperate from all other skilled interventions    Myofascial Release  to superior-lateral knee to reduce scar tissue and increase ROM               PT Short Term Goals - 10/18/19 1542      PT SHORT TERM GOAL #1   Title  Patient will report understanding and regular compliance with HEP to improve ROM, strength, and overall functional mobility.    Time  3    Period  Weeks    Status  New    Target Date  11/08/19      PT SHORT TERM GOAL #2   Title  Patient will demonstrate right knee flexion active range of motion of at least 95 degrees to assist with more normalized gait pattern and stair ambulation.    Time  3    Period  Weeks    Target Date  11/08/19        PT Long Term Goals - 10/18/19 1544      PT LONG TERM GOAL #1   Title  Patient will demonstrate right knee flexion active range of motion of at least 120 degrees to assist with squatting and stair ambulation.    Time  6    Period  Weeks    Status  New    Target Date  11/29/19      PT LONG TERM GOAL #2   Title   Patient will demonstrate ability to ambulate with normalized knee flexion and without circumduction of the right lower extremity during .    Time  6    Period  Weeks    Status  New    Target Date  11/29/19      PT LONG TERM GOAL #3   Title  Patient will demonstrate ability to perform a straight leg raise on the RT LE without any signs of quad lag.    Time  6    Period  Weeks    Status  New    Target Date  11/29/19      PT LONG TERM GOAL #4   Title  Patient will demonstrate improvement of at least 10% on the FOTO test indicating improved percieved functional mobility.    Time  6    Period  Weeks    Status  New    Target Date  11/29/19            Plan - 11/02/19 1602    Clinical Impression Statement  Pt just returning from beach and overall doing great wihtout any complaints. STates steps continue to be her most limiting but otherwise no issues.  Continued working on eccentric strengthening and reduced lunges to floor level this session.  Added leg press with good form/control.  Manual completed at EOS with knee bent with ability to find tightest area of scar tissue superior-lateral knee.  Able to reduce and improved flexion today to 125 degrees (was 100 last week).  Pts Lt knee is 135 so she would still like to get more flexion if able.    Personal Factors and Comorbidities  Comorbidity 2    Comorbidities  Depression, Anxiety    Examination-Activity Limitations  Stand;Lift;Locomotion Level;Bend;Transfers;Squat;Stairs    Examination-Participation Restrictions  Yard Work;Cleaning;Community Activity    Stability/Clinical Decision Making  Stable/Uncomplicated    Rehab Potential  Good    PT Frequency  3x / week    PT Duration  6 weeks  PT Treatment/Interventions  ADLs/Self Care Home Management;Aquatic Therapy;Cryotherapy;Electrical Stimulation;Moist Heat;DME Instruction;Gait training;Stair training;Functional mobility training;Therapeutic activities;Therapeutic exercise;Balance  training;Neuromuscular re-education;Patient/family education;Orthotic Fit/Training;Manual techniques;Scar mobilization;Passive range of motion;Dry needling;Energy conservation;Taping    PT Next Visit Plan  Focus on increasing RT knee flexion AROM, strength and stability.    PT Home Exercise Plan  10/18/19: quad set, heel slide    Consulted and Agree with Plan of Care  Patient       Patient will benefit from skilled therapeutic intervention in order to improve the following deficits and impairments:  Abnormal gait, Improper body mechanics, Decreased mobility, Decreased activity tolerance, Decreased endurance, Decreased range of motion, Decreased strength, Hypomobility, Difficulty walking, Impaired flexibility  Visit Diagnosis: Muscle weakness (generalized)  Other abnormalities of gait and mobility  Stiffness of right knee, not elsewhere classified     Problem List Patient Active Problem List   Diagnosis Date Noted  . Screening for colorectal cancer 02/03/2019  . Encounter for well woman exam with routine gynecological exam 02/03/2019  . Depression 11/22/2018  . Anxiety 11/22/2018  . 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 Sterilization 11/13/2011   Lurena Nida, PTA/CLT [phone]  Lurena Nida 11/02/2019, 4:05 PM  [facility] [address] [place], [place], [address] Phone: [phone]   Fax:  [phone]  Name: [name] MRN: [id number] Date of Birth: 01-11-1976

## 2019-11-04 ENCOUNTER — Ambulatory Visit (HOSPITAL_COMMUNITY): Payer: 59 | Admitting: Physical Therapy

## 2019-11-04 ENCOUNTER — Telehealth (HOSPITAL_COMMUNITY): Payer: Self-pay | Admitting: Physical Therapy

## 2019-11-04 NOTE — Telephone Encounter (Signed)
Called regarding patient not showing for appointment this date. There was no answer so left a message reminding patient of next scheduled appointment and to call the clinic if unable to make it.   Verne Carrow PT, DPT 2:58 PM, 11/04/19 [phone]

## 2019-11-07 ENCOUNTER — Other Ambulatory Visit: Payer: Self-pay

## 2019-11-07 ENCOUNTER — Ambulatory Visit (HOSPITAL_COMMUNITY): Payer: 59 | Admitting: Physical Therapy

## 2019-11-07 DIAGNOSIS — M25661 Stiffness of right knee, not elsewhere classified: Secondary | ICD-10-CM | POA: Diagnosis not present

## 2019-11-07 DIAGNOSIS — R2689 Other abnormalities of gait and mobility: Secondary | ICD-10-CM

## 2019-11-07 DIAGNOSIS — M6281 Muscle weakness (generalized): Secondary | ICD-10-CM

## 2019-11-07 NOTE — Therapy (Addendum)
[place], [place], [address] Phone: [phone]   Fax:  [phone]  Physical Therapy Treatment / Discharge Summary  Patient Details  Name: [name] MRN: [id number] Date of Birth: [birth year] Referring Provider (PT): Edmonia Lynch, MD   Encounter Date: 2/67/1245   As a licensed physical therapist I have read and agree with the following note.   Clarene Critchley PT, DPT 10:02 AM, 11/08/19 [phone]  Progress Note Reporting Period 10/18/19 to 11/08/19  See note below for Objective Data and Assessment of Progress/Goals.       PHYSICAL THERAPY DISCHARGE SUMMARY  Visits from Start of Care: 5  Current functional level related to goals / functional outcomes: See below   Remaining deficits: See below   Education / Equipment: See below Plan: Patient agrees to discharge.  Patient goals were met. Patient is being discharged due to meeting the stated rehab goals.  ?????        PT End of Session - 11/07/19 1448    Visit Number  5    Number of Visits  19    Date for PT Re-Evaluation  11/29/19    Authorization Type  [employer] (30 visit limit)    Authorization Time Period  10/18/19 - 11/29/19    Authorization - Visit Number  5    Authorization - Number of Visits  30    Progress Note Due on Visit  10    PT Start Time  0539    PT Stop Time  1440    PT Time Calculation (min)  35 min    Equipment Utilized During Treatment  Right knee immobilizer    Activity Tolerance  Patient tolerated treatment well    Behavior During Therapy  [facility] for tasks assessed/performed       Past Medical History:  Diagnosis Date  . Anxiety   . Chronic constipation   . Complication of anesthesia    PONV  . Degenerative disc disease    L1 and L5 per pt, no meds  . Depression   . Right patella fracture    fell 08-31-2019  . Wears glasses     Past Surgical History:  Procedure Laterality Date  . CESAREAN SECTION      x 3  . CHOLECYSTECTOMY    . DILATION AND CURETTAGE OF UTERUS    . ORIF PATELLA Right 09/06/2019   Procedure: OPEN REDUCTION INTERNAL (ORIF) FIXATION PATELLA;  Surgeon: Renette Butters, MD;  Location: [facility];  Service: Orthopedics;  Laterality: Right;  . TUBAL LIGATION    . WISDOM TOOTH EXTRACTION      There were no vitals filed for this visit.  Subjective Assessment - 11/07/19 1458    Subjective  pt states she is doing great.  Pt with questions regarding further need of therapy.    Currently in Pain?  No/denies         [facility] PT Assessment - 11/07/19 1444      Assessment   Medical Diagnosis  ORIF RT Patella & capsular repair    Referring Provider (PT)  Edmonia Lynch, MD    Onset Date/Surgical Date  09/06/19    Next MD Visit  --   In about 6 weeks from 10/17/19   Prior Therapy  None      Precautions   Precautions  Knee    Required Braces or Orthoses  --    Knee Immobilizer - Right  --  Restrictions   Weight Bearing Restrictions  Yes    RLE Weight Bearing  Weight bearing as tolerated      Home Environment   Living Environment  --    Living Arrangements  --    Type of Home  --    Home Access  --    Entrance Stairs-Number of Steps  --    Entrance Stairs-Rails  --    Home Layout  --    Home Equipment  --    Additional Comments  --      Prior Function   Level of Independence  Independent;Independent with basic ADLs    Vocation  Full time employment    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      Cognition   Overall Cognitive Status  Within Functional Limits for tasks assessed      Observation/Other Assessments   Observations  --    Focus on Therapeutic Outcomes (FOTO)   now only 15% limited   was 51%     Observation/Other Assessments-Edema    Edema  Circumferential      Circumferential Edema   Circumferential - Right  43 cm at knee joint line   was 44 cm   Circumferential - Left   41.5 cm at knee joint line      Sensation    Light Touch  --      AROM   Right Knee Extension  0    Right Knee Flexion  130   was 82 degrees   Left Knee Extension  0    Left Knee Flexion  136      Strength   Overall Strength Comments  SLR RT LE slight quad lag    Right Hip Flexion  5/5   was 4-5   Left Hip Flexion  5/5    Right Knee Flexion  5/5   was 4+/5   Right Knee Extension  5/5   was 4/5   Left Knee Flexion  5/5    Left Knee Extension  5/5    Right Ankle Dorsiflexion  5/5    Left Ankle Dorsiflexion  5/5      Palpation   Patella mobility  --      Ambulation/Gait   Ambulation/Gait  --    Ambulation Distance (Feet)  --    Gait Pattern  --                           PT Education - 11/07/19 1448    Education Details  continue with therex and progress her walking on the trails/track.  Educated on compression garments and given measurements and information on Elastic therapy to order thigh highs if needed.    Person(s) Educated  Patient    Methods  Explanation;Handout    Comprehension  Verbalized understanding       PT Short Term Goals - 11/07/19 1418      PT SHORT TERM GOAL #1   Title  Patient will report understanding and regular compliance with HEP to improve ROM, strength, and overall functional mobility.    Time  3    Period  Weeks    Status  Achieved    Target Date  11/08/19      PT SHORT TERM GOAL #2   Title  Patient will demonstrate right knee flexion active range of motion of at least 95 degrees to assist with more normalized gait pattern and stair ambulation.  Time  3    Period  Weeks    Status  Achieved    Target Date  11/08/19        PT Long Term Goals - 11/07/19 1419      PT LONG TERM GOAL #1   Title  Patient will demonstrate right knee flexion active range of motion of at least 120 degrees to assist with squatting and stair ambulation.    Time  6    Period  Weeks    Status  Achieved      PT LONG TERM GOAL #2   Title  Patient will demonstrate ability to  ambulate with normalized knee flexion and without circumduction of the right lower extremity during 2MWT.    Time  6    Period  Weeks    Status  Achieved      PT LONG TERM GOAL #3   Title  Patient will demonstrate ability to perform a straight leg raise on the RT LE without any signs of quad lag.    Time  6    Period  Weeks    Status Achieved      PT LONG TERM GOAL #4   Title  Patient will demonstrate improvement of at least 10% on the FOTO test indicating improved percieved functional mobility.    Time  6    Period  Weeks    Status  Achieved            Plan - 11/07/19 1454    Clinical Impression Statement  Pt returns today with questions regarding further needs for therapy.  STates she is back to doing all things as previous, exercising and walking the trail.  She has been to the beach several times walking in the sand wihtout diffiuclty and negotiating stairs reciprocally without issues.  Pt measured with all values now WNL and all goals met.  pt is aggreeable to discharge at this time due to no further skilled needs. Pt does conitinue to have mild swelling in Rt knee as compared to LT.  Given measurenents and information for ordering thigh highs to help with this.    Personal Factors and Comorbidities  Comorbidity 2    Comorbidities  Depression, Anxiety    Examination-Activity Limitations  Stand;Lift;Locomotion Level;Bend;Transfers;Squat;Stairs    Examination-Participation Restrictions  Yard Work;Cleaning;Community Activity    Stability/Clinical Decision Making  Stable/Uncomplicated    Rehab Potential  Good    PT Frequency  3x / week    PT Duration  6 weeks    PT Treatment/Interventions  ADLs/Self Care Home Management;Aquatic Therapy;Cryotherapy;Electrical Stimulation;Moist Heat;DME Instruction;Gait training;Stair training;Functional mobility training;Therapeutic activities;Therapeutic exercise;Balance training;Neuromuscular re-education;Patient/family education;Orthotic  Fit/Training;Manual techniques;Scar mobilization;Passive range of motion;Dry needling;Energy conservation;Taping    PT Next Visit Plan  Discharge from therapy.    PT Home Exercise Plan  10/18/19: quad set, heel slide    Consulted and Agree with Plan of Care  Patient       Patient will benefit from skilled therapeutic intervention in order to improve the following deficits and impairments:  Abnormal gait, Improper body mechanics, Decreased mobility, Decreased activity tolerance, Decreased endurance, Decreased range of motion, Decreased strength, Hypomobility, Difficulty walking, Impaired flexibility  Visit Diagnosis: Muscle weakness (generalized)  Other abnormalities of gait and mobility  Stiffness of right knee, not elsewhere classified     Problem List Patient Active Problem List   Diagnosis Date Noted  . Screening for colorectal cancer 02/03/2019  . Encounter for well woman exam with routine gynecological exam  02/03/2019  . Depression 11/22/2018  . Anxiety 11/22/2018  . 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 Sterilization 11/13/2011    Teena Irani 11/07/2019, 2:58 PM  [facility] [address] [place], [place], [address] Phone: [phone]   Fax:  [phone]  Name: [name] MRN: [id number] Date of Birth: [birth year]

## 2019-11-09 ENCOUNTER — Ambulatory Visit (HOSPITAL_COMMUNITY): Payer: 59 | Admitting: Physical Therapy

## 2019-11-11 ENCOUNTER — Ambulatory Visit (HOSPITAL_COMMUNITY): Payer: 59 | Admitting: Physical Therapy

## 2019-11-14 ENCOUNTER — Ambulatory Visit (HOSPITAL_COMMUNITY): Payer: 59 | Admitting: Physical Therapy

## 2019-11-16 ENCOUNTER — Encounter (HOSPITAL_COMMUNITY): Payer: 59 | Admitting: Physical Therapy

## 2019-11-18 ENCOUNTER — Encounter (HOSPITAL_COMMUNITY): Payer: 59 | Admitting: Physical Therapy

## 2019-11-21 ENCOUNTER — Encounter (HOSPITAL_COMMUNITY): Payer: 59 | Admitting: Physical Therapy

## 2019-11-23 ENCOUNTER — Encounter (HOSPITAL_COMMUNITY): Payer: 59 | Admitting: Physical Therapy

## 2019-11-23 ENCOUNTER — Telehealth: Payer: Self-pay | Admitting: Obstetrics and Gynecology

## 2019-11-23 NOTE — Telephone Encounter (Signed)
Pt states she's been taking a new sleeping pill and does not feel that it has been helping and wants to try a diff alternative

## 2019-11-25 ENCOUNTER — Encounter (HOSPITAL_COMMUNITY): Payer: 59 | Admitting: Physical Therapy

## 2019-11-25 NOTE — Telephone Encounter (Signed)
Last seen by me 5 yrs ago, seen by Cyril Mourning 01/2019. Will route to her.

## 2019-11-28 ENCOUNTER — Encounter (HOSPITAL_COMMUNITY): Payer: 59 | Admitting: Physical Therapy

## 2019-11-29 NOTE — Telephone Encounter (Signed)
Pt would like to see if her appt can be a virtual appt. Pt was seen last year in July with Candise Bowens for pap/physical.

## 2019-11-30 ENCOUNTER — Encounter (HOSPITAL_COMMUNITY): Payer: 59 | Admitting: Physical Therapy

## 2019-12-02 ENCOUNTER — Encounter (HOSPITAL_COMMUNITY): Payer: 59 | Admitting: Physical Therapy

## 2020-01-28 ENCOUNTER — Emergency Department (HOSPITAL_COMMUNITY)
Admission: EM | Admit: 2020-01-28 | Discharge: 2020-01-29 | Disposition: A | Discharge status: Home / Self Care | Payer: 59 | Attending: Emergency Medicine | Admitting: Emergency Medicine

## 2020-01-28 ENCOUNTER — Encounter (HOSPITAL_COMMUNITY): Payer: Self-pay

## 2020-01-28 ENCOUNTER — Other Ambulatory Visit: Payer: Self-pay

## 2020-01-28 DIAGNOSIS — X58XXXA Exposure to other specified factors, initial encounter: Secondary | ICD-10-CM | POA: Insufficient documentation

## 2020-01-28 DIAGNOSIS — Y929 Unspecified place or not applicable: Secondary | ICD-10-CM | POA: Insufficient documentation

## 2020-01-28 DIAGNOSIS — T63441A Toxic effect of venom of bees, accidental (unintentional), initial encounter: Secondary | ICD-10-CM | POA: Insufficient documentation

## 2020-01-28 DIAGNOSIS — Y999 Unspecified external cause status: Secondary | ICD-10-CM | POA: Diagnosis not present

## 2020-01-28 DIAGNOSIS — L299 Pruritus, unspecified: Secondary | ICD-10-CM | POA: Insufficient documentation

## 2020-01-28 DIAGNOSIS — Y939 Activity, unspecified: Secondary | ICD-10-CM | POA: Diagnosis not present

## 2020-01-28 DIAGNOSIS — T7840XA Allergy, unspecified, initial encounter: Secondary | ICD-10-CM

## 2020-01-28 MED ORDER — PREDNISONE 50 MG PO TABS
60.0000 mg | ORAL_TABLET | Freq: Once | ORAL | Status: AC
  Administered 2020-01-28: 60 mg via ORAL
  Filled 2020-01-28: qty 1

## 2020-01-28 MED ORDER — DIPHENHYDRAMINE HCL 25 MG PO CAPS
25.0000 mg | ORAL_CAPSULE | Freq: Once | ORAL | Status: AC
  Administered 2020-01-28: 25 mg via ORAL
  Filled 2020-01-28: qty 1

## 2020-01-28 MED ORDER — FAMOTIDINE 20 MG PO TABS
20.0000 mg | ORAL_TABLET | Freq: Once | ORAL | Status: AC
  Administered 2020-01-28: 20 mg via ORAL
  Filled 2020-01-28: qty 1

## 2020-01-28 NOTE — ED Triage Notes (Signed)
Pt was at home this morning helping spouse wove a vehicle out of the woods, when Pt reports she stepped on a wasp/bees nest in the ground and was attacked and stung multiple times. Pt reports using cortisone ointment, benadryl and calamine lotion at home without relief from itching. Redness in noted on skin where Pt has been scratching.

## 2020-01-28 NOTE — ED Provider Notes (Signed)
[facility] EMERGENCY DEPARTMENT Provider Note   CSN: [id number] Arrival date & time: 01/28/20  2223     History Chief Complaint  Patient presents with  . Pruritis    [name] is a 44 y.o. female.  Patient presents with diffuse itching all over.  States this started after she "stepped in a wasp nest" about 830 this morning while attempting to move a vehicle out of the woods.  States she was warned by multiple insects which she believes were bees or wasps.  Had multiple stings throughout her body.  Initially felt like needles.  Developed diffuse itching several hours later that is unrelieved by taking Benadryl at home as well as calamine lotion.  States she last took Benadryl about 1 hour ago.  Denies any difficulty breathing or difficulty swallowing.  No chest pain or shortness of breath.  No nausea or vomiting.  No abdominal pain.  No tongue or lip swelling.  No known allergies.  The history is provided by the patient.       Past Medical History:  Diagnosis Date  . Anxiety   . Chronic constipation   . Complication of anesthesia    PONV  . Degenerative disc disease    L1 and L5 per pt, no meds  . Depression   . Right patella fracture    fell 08-31-2019  . Wears glasses     Patient Active Problem List   Diagnosis Date Noted  . Screening for colorectal cancer 02/03/2019  . Encounter for well woman exam with routine gynecological exam 02/03/2019  . Depression 11/22/2018  . Anxiety 11/22/2018  . 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 Sterilization 11/13/2011    Past Surgical History:  Procedure Laterality Date  . CESAREAN SECTION     x 3  . CHOLECYSTECTOMY    . DILATION AND CURETTAGE OF UTERUS    . ORIF PATELLA Right 09/06/2019   Procedure: OPEN REDUCTION INTERNAL (ORIF) FIXATION PATELLA;  Surgeon: Sheral Apley, MD;  Location: [facility];  Service: Orthopedics;  Laterality: Right;  . TUBAL LIGATION    . WISDOM TOOTH EXTRACTION        OB History    Gravida  6   Para  4   Term  4   Preterm  0   AB  2   Living  4     SAB  0   TAB  2   Ectopic  0   Multiple  0   Live Births  1           Family History  Problem Relation Age of Onset  . Cancer Maternal Grandmother   . Diabetes Maternal Grandmother   . Anesthesia problems Neg Hx     Social History   Tobacco Use  . Smoking status: Never Smoker  . Smokeless tobacco: Never Used  Vaping Use  . Vaping Use: Never used  Substance Use Topics  . Alcohol use: No  . Drug use: Never    Home Medications Prior to Admission medications   Medication Sig Start Date End Date Taking? Authorizing Provider  aspirin EC 81 MG tablet Take 1 tablet (81 mg total) by mouth 2 (two) times daily. For DVT prophylaxis for 30 days after surgery. 09/06/19   Martensen, Lucretia Kern III, PA-C  busPIRone (BUSPAR) 7.5 MG tablet Take 1 tablet (7.5 mg total) by mouth 3 (three) times daily. Patient taking differently: Take 7.5 mg by mouth 3 (three)  times daily.  02/03/19   Adline Potter, NP  hydrOXYzine (ATARAX/VISTARIL) 25 MG tablet Take at HS prn Patient taking differently: Take by mouth at bedtime. Take at HS prn 02/03/19   Adline Potter, NP  LINZESS 145 MCG CAPS capsule TAKE (1) CAPSULE BY MOUTH EVERY DAY. Patient taking differently: Take 145 mcg by mouth daily before breakfast.  11/19/17   Adline Potter, NP  methocarbamol (ROBAXIN-750) 750 MG tablet Take 1 tablet (750 mg total) by mouth every 8 (eight) hours as needed for muscle spasms. 09/06/19   Albina Billet III, PA-C  ondansetron (ZOFRAN) 4 MG tablet Take 1 tablet (4 mg total) by mouth every 8 (eight) hours as needed for nausea or vomiting. 09/06/19   Albina Billet III, PA-C  PARoxetine (PAXIL) 20 MG tablet Take 1 tablet (20 mg total) by mouth daily. Patient taking differently: Take 20 mg by mouth daily.  02/03/19   Adline Potter, NP    Allergies    Patient has no known  allergies.  Review of Systems   Review of Systems  Constitutional: Negative for activity change, appetite change, fatigue and fever.  HENT: Negative for congestion and rhinorrhea.   Eyes: Negative for visual disturbance.  Respiratory: Negative for cough, chest tightness and shortness of breath.   Cardiovascular: Negative for chest pain and leg swelling.  Gastrointestinal: Negative for abdominal pain, nausea and vomiting.  Genitourinary: Negative for dysuria and hematuria.  Skin: Positive for rash.  Neurological: Negative for dizziness, weakness and headaches.   all other systems are negative except as noted in the HPI and PMH.    Physical Exam Updated Vital Signs BP 136/89 (BP Location: Right Arm)   Pulse 84   Temp (!) 97.5 F (36.4 C) (Oral)   Resp 16   Ht 5\' 8"  (1.727 m)   Wt 72.6 kg   LMP 01/28/2020   SpO2 98%   BMI 24.33 kg/m   Physical Exam Vitals and nursing note reviewed.  Constitutional:      General: She is not in acute distress.    Appearance: She is well-developed. She is not ill-appearing.     Comments: Scratching arms and legs  HENT:     Head: Normocephalic and atraumatic.     Nose: Nose normal. No rhinorrhea.     Mouth/Throat:     Mouth: Mucous membranes are moist.     Pharynx: No oropharyngeal exudate.     Comments: No tongue or lip swelling Eyes:     Conjunctiva/sclera: Conjunctivae normal.     Pupils: Pupils are equal, round, and reactive to light.  Neck:     Comments: No meningismus. Cardiovascular:     Rate and Rhythm: Normal rate and regular rhythm.     Heart sounds: Normal heart sounds. No murmur heard.   Pulmonary:     Effort: Pulmonary effort is normal. No respiratory distress.     Breath sounds: Normal breath sounds. No wheezing.  Abdominal:     Palpations: Abdomen is soft.     Tenderness: There is no abdominal tenderness. There is no guarding or rebound.  Musculoskeletal:        General: No tenderness. Normal range of motion.      Cervical back: Normal range of motion and neck supple.  Skin:    General: Skin is warm.     Findings: Rash present.     Comments: Scattered linear areas of erythema where she has been itching.  There is no  appreciable urticaria or other rash.  Neurological:     Mental Status: She is alert and oriented to person, place, and time.     Cranial Nerves: No cranial nerve deficit.     Motor: No abnormal muscle tone.     Coordination: Coordination normal.     Comments:  5/5 strength throughout. CN 2-12 intact.Equal grip strength.   Psychiatric:        Behavior: Behavior normal.     ED Results / Procedures / Treatments   Labs (all labs ordered are listed, but only abnormal results are displayed) Labs Reviewed - No data to display  EKG None  Radiology No results found.  Procedures Procedures (including critical care time)  Medications Ordered in ED Medications  famotidine (PEPCID) tablet 20 mg (has no administration in time range)  diphenhydrAMINE (BENADRYL) capsule 25 mg (has no administration in time range)  predniSONE (DELTASONE) tablet 60 mg (has no administration in time range)    ED Course  I have reviewed the triage vital signs and the nursing notes.  Pertinent labs & imaging results that were available during my care of the patient were reviewed by me and considered in my medical decision making (see chart for details).    MDM Rules/Calculators/A&P                         Itching all over after multiple bee stings.  No evidence of anaphylaxis.  No tongue or lip swelling.  No wheezing.   will give oral antihistamines and steroids  Patient improved.  There is no tongue or lip swelling.  No chest pain or shortness of breath.  We will give a course of p.o. steroids as well as H1 and H2 blockers.  Discussed caution with antihistamines as they could be sedating. Return to the ED with difficulty breathing, difficulty swallowing, tongue or lip swelling or other  concerns. Final Clinical Impression(s) / ED Diagnoses Final diagnoses:  Allergic reaction, initial encounter    Rx / DC Orders ED Discharge Orders    None       Lavonda Thal, Jeannett Senior, MD 01/29/20 [phone]

## 2020-01-29 MED ORDER — DIPHENHYDRAMINE HCL 25 MG PO TABS: 25.0000 mg | ORAL_TABLET | Freq: Four times a day (QID) | ORAL | 0 refills | Status: DC | PRN

## 2020-01-29 MED ORDER — HYDROXYZINE HCL 25 MG PO TABS
25.0000 mg | ORAL_TABLET | Freq: Once | ORAL | Status: AC
  Administered 2020-01-29: 25 mg via ORAL
  Filled 2020-01-29: qty 1

## 2020-01-29 MED ORDER — DIPHENHYDRAMINE HCL 50 MG/ML IJ SOLN
25.0000 mg | Freq: Once | INTRAMUSCULAR | Status: AC
  Administered 2020-01-29: 25 mg via INTRAMUSCULAR
  Filled 2020-01-29: qty 1

## 2020-01-29 MED ORDER — FAMOTIDINE 20 MG PO TABS: 20.0000 mg | ORAL_TABLET | Freq: Two times a day (BID) | ORAL | 0 refills | Status: DC

## 2020-01-29 MED ORDER — PREDNISONE 50 MG PO TABS: ORAL_TABLET | ORAL | 0 refills | Status: DC

## 2020-01-29 NOTE — Discharge Instructions (Signed)
Take the steroids and antihistamines as prescribed.  Follow-up with your doctor.  Return to the ED with difficulty breathing, chest pain, shortness of breath, tongue or lip swelling or any other concerns.

## 2020-03-08 ENCOUNTER — Other Ambulatory Visit: Payer: Self-pay | Admitting: Adult Health

## 2020-04-23 ENCOUNTER — Other Ambulatory Visit: Payer: 59

## 2020-04-26 ENCOUNTER — Other Ambulatory Visit: Payer: Self-pay

## 2020-04-26 ENCOUNTER — Other Ambulatory Visit: Payer: 59

## 2020-04-26 DIAGNOSIS — Z20822 Contact with and (suspected) exposure to covid-19: Secondary | ICD-10-CM

## 2020-04-27 LAB — SPECIMEN STATUS REPORT

## 2020-04-27 LAB — NOVEL CORONAVIRUS, NAA: SARS-CoV-2, NAA: DETECTED — AB

## 2020-04-27 LAB — SARS-COV-2, NAA 2 DAY TAT

## 2020-04-28 ENCOUNTER — Telehealth: Payer: Self-pay | Admitting: Family

## 2020-04-28 NOTE — Telephone Encounter (Signed)
Called to Discuss with patient about Covid symptoms and the use of the monoclonal antibody infusion for those with mild to moderate Covid symptoms and at a high risk of hospitalization.     Pt appears to qualify for this infusion due to co-morbid conditions and/or a member of an at-risk group in accordance with the FDA Emergency Use Authorization.    [name] tested positive for COVID on 04/26/2020.  Qualifying risk factors include BMI greater than 25.  I was unable to get in touch with [name] via phone and left a generic message with the hotline number.  My chart message was sent.  Marcos Eke, NP 04/28/2020 1:47 PM

## 2020-04-29 ENCOUNTER — Telehealth: Payer: Self-pay | Admitting: Physician Assistant

## 2020-04-29 NOTE — Telephone Encounter (Signed)
Called to Discuss with patient about Covid symptoms and the use of the monoclonal antibody infusion for those with mild to moderate Covid symptoms and at a high risk of hospitalization.     Pt appears to qualify for this infusion due to co-morbid conditions and/or a member of an at-risk group in accordance with the FDA Emergency Use Authorization.    Unable to reach pt. Left voice mail to call back.   

## 2020-05-30 ENCOUNTER — Other Ambulatory Visit: Payer: Self-pay | Admitting: Adult Health

## 2020-06-05 ENCOUNTER — Other Ambulatory Visit (HOSPITAL_COMMUNITY): Payer: Self-pay | Admitting: Adult Health

## 2020-06-05 DIAGNOSIS — Z1231 Encounter for screening mammogram for malignant neoplasm of breast: Secondary | ICD-10-CM

## 2020-06-22 ENCOUNTER — Other Ambulatory Visit: Payer: Self-pay

## 2020-06-22 ENCOUNTER — Ambulatory Visit (HOSPITAL_COMMUNITY)
Admission: RE | Admit: 2020-06-22 | Discharge: 2020-06-22 | Disposition: A | Discharge status: Home / Self Care | Payer: 59 | Source: Ambulatory Visit | Attending: Adult Health | Admitting: Adult Health

## 2020-06-22 DIAGNOSIS — Z1231 Encounter for screening mammogram for malignant neoplasm of breast: Secondary | ICD-10-CM | POA: Insufficient documentation

## 2020-07-20 ENCOUNTER — Other Ambulatory Visit: Payer: 59

## 2020-07-20 DIAGNOSIS — Z20822 Contact with and (suspected) exposure to covid-19: Secondary | ICD-10-CM

## 2020-07-24 LAB — NOVEL CORONAVIRUS, NAA: SARS-CoV-2, NAA: NOT DETECTED

## 2020-07-27 ENCOUNTER — Other Ambulatory Visit: Payer: 59

## 2020-08-03 ENCOUNTER — Other Ambulatory Visit: Payer: 59

## 2020-08-10 ENCOUNTER — Other Ambulatory Visit: Payer: 59

## 2020-08-17 ENCOUNTER — Other Ambulatory Visit: Payer: 59

## 2020-08-24 ENCOUNTER — Other Ambulatory Visit: Payer: 59

## 2020-11-12 ENCOUNTER — Other Ambulatory Visit: Payer: Self-pay | Admitting: Adult Health

## 2020-12-05 ENCOUNTER — Telehealth: Payer: Self-pay | Admitting: Adult Health

## 2020-12-05 DIAGNOSIS — Z01419 Encounter for gynecological examination (general) (routine) without abnormal findings: Secondary | ICD-10-CM

## 2020-12-05 NOTE — Telephone Encounter (Signed)
Left message will put order in for labs, but call for appt for pap and physical

## 2020-12-05 NOTE — Addendum Note (Signed)
Addended by: Cyril Mourning A on: 12/05/2020 01:32 PM   Modules accepted: Orders

## 2020-12-05 NOTE — Telephone Encounter (Signed)
Patient called stating that she would like for Victorino Dike to place an order for her yearly blood work to [facility], she would like to know if she could do this with out her being seen. Please contact pt

## 2020-12-07 ENCOUNTER — Telehealth: Payer: Self-pay | Admitting: Adult Health

## 2020-12-07 LAB — COMPREHENSIVE METABOLIC PANEL WITH GFR
ALT: 25 [IU]/L (ref 0–32)
AST: 20 [IU]/L (ref 0–40)
Albumin/Globulin Ratio: 1.4 (ref 1.2–2.2)
Albumin: 4 g/dL (ref 3.8–4.8)
Alkaline Phosphatase: 71 [IU]/L (ref 44–121)
BUN/Creatinine Ratio: 15 (ref 9–23)
BUN: 17 mg/dL (ref 6–24)
Bilirubin Total: 0.3 mg/dL (ref 0.0–1.2)
CO2: 22 mmol/L (ref 20–29)
Calcium: 9.4 mg/dL (ref 8.7–10.2)
Chloride: 102 mmol/L (ref 96–106)
Creatinine, Ser: 1.11 mg/dL — ABNORMAL HIGH (ref 0.57–1.00)
Globulin, Total: 2.9 g/dL (ref 1.5–4.5)
Glucose: 94 mg/dL (ref 65–99)
Potassium: 4.6 mmol/L (ref 3.5–5.2)
Sodium: 137 mmol/L (ref 134–144)
Total Protein: 6.9 g/dL (ref 6.0–8.5)
eGFR: 62 mL/min/{1.73_m2}

## 2020-12-07 LAB — TSH: TSH: 2.04 u[IU]/mL (ref 0.450–4.500)

## 2020-12-07 LAB — CBC
Hematocrit: 43.2 % (ref 34.0–46.6)
Hemoglobin: 14.2 g/dL (ref 11.1–15.9)
MCH: 28.5 pg (ref 26.6–33.0)
MCHC: 32.9 g/dL (ref 31.5–35.7)
MCV: 87 fL (ref 79–97)
Platelets: 291 10*3/uL (ref 150–450)
RBC: 4.98 x10E6/uL (ref 3.77–5.28)
RDW: 13.3 % (ref 11.7–15.4)
WBC: 6.9 10*3/uL (ref 3.4–10.8)

## 2020-12-07 LAB — LIPID PANEL
Chol/HDL Ratio: 3.7 ratio (ref 0.0–4.4)
Cholesterol, Total: 203 mg/dL — ABNORMAL HIGH (ref 100–199)
HDL: 55 mg/dL (ref >39)
LDL Chol Calc (NIH): 133 mg/dL — ABNORMAL HIGH (ref 0–99)
Triglycerides: 81 mg/dL (ref 0–149)
VLDL Cholesterol Cal: 15 mg/dL (ref 5–40)

## 2020-12-07 NOTE — Telephone Encounter (Signed)
Pt aware of labs will recheck CMP and urine for protein  in 3 weeks,call for appt

## 2020-12-07 NOTE — Telephone Encounter (Signed)
Pt saw her lab results Has questions about an elevated Creatinine serum  Please advise & notify pt

## 2020-12-26 ENCOUNTER — Telehealth: Payer: Self-pay | Admitting: Adult Health

## 2020-12-28 ENCOUNTER — Other Ambulatory Visit: Payer: Self-pay

## 2020-12-28 DIAGNOSIS — R7989 Other specified abnormal findings of blood chemistry: Secondary | ICD-10-CM

## 2020-12-29 LAB — URINALYSIS
Bilirubin, UA: NEGATIVE
Glucose, UA: NEGATIVE
Ketones, UA: NEGATIVE
Nitrite, UA: NEGATIVE
Specific Gravity, UA: 1.02 (ref 1.005–1.030)
Urobilinogen, Ur: 0.2 mg/dL (ref 0.2–1.0)
pH, UA: 5.5 (ref 5.0–7.5)

## 2020-12-29 LAB — COMPREHENSIVE METABOLIC PANEL
ALT: 16 IU/L (ref 0–32)
AST: 16 IU/L (ref 0–40)
Albumin/Globulin Ratio: 1.5 (ref 1.2–2.2)
Albumin: 4 g/dL (ref 3.8–4.8)
Alkaline Phosphatase: 64 IU/L (ref 44–121)
BUN/Creatinine Ratio: 14 (ref 9–23)
BUN: 13 mg/dL (ref 6–24)
Bilirubin Total: 0.2 mg/dL (ref 0.0–1.2)
CO2: 22 mmol/L (ref 20–29)
Calcium: 9.1 mg/dL (ref 8.7–10.2)
Chloride: 104 mmol/L (ref 96–106)
Creatinine, Ser: 0.93 mg/dL (ref 0.57–1.00)
Globulin, Total: 2.6 g/dL (ref 1.5–4.5)
Glucose: 97 mg/dL (ref 65–99)
Potassium: 4.5 mmol/L (ref 3.5–5.2)
Sodium: 140 mmol/L (ref 134–144)
Total Protein: 6.6 g/dL (ref 6.0–8.5)
eGFR: 76 mL/min/{1.73_m2} (ref >59)

## 2020-12-31 NOTE — Telephone Encounter (Signed)
Pt prefers a call back as she's having trouble navigating her MyChart

## 2020-12-31 NOTE — Telephone Encounter (Signed)
Pt was on her period, will recheck urine at visit in July

## 2020-12-31 NOTE — Telephone Encounter (Signed)
Pt called back to say yes she was on her cycle  Pt states she was unable to reply to the MyChart message  Please advise

## 2021-01-23 ENCOUNTER — Other Ambulatory Visit: Payer: Self-pay | Admitting: Adult Health

## 2021-01-28 ENCOUNTER — Other Ambulatory Visit: Payer: Self-pay | Admitting: Adult Health

## 2021-02-04 ENCOUNTER — Telehealth: Payer: Self-pay | Admitting: Adult Health

## 2021-02-04 NOTE — Telephone Encounter (Addendum)
Pt was on Buspar and Paxil. Stopped those meds about 1 month ago because she didn't see that it was helping. Pt has an appt 8/15 but wanted to reach out sooner. Pt states she is ill all day and worrying about stuff she shouldn't be worrying about. Pt is not suicidal nor does she feel like she could harm anyone. I advised it will more than likely require an office visit but I would let you review. Thanks!! JSY

## 2021-02-04 NOTE — Telephone Encounter (Signed)
Patient called stating that she would like a call back from [place] Regarding her Depression medication. Please contact pt

## 2021-02-04 NOTE — Telephone Encounter (Signed)
[name] says she is ill, sje stopped Paxil and Buspar, will work in Wednesday at 1:50 pm

## 2021-02-06 ENCOUNTER — Encounter: Payer: Self-pay | Admitting: Adult Health

## 2021-02-06 ENCOUNTER — Ambulatory Visit (INDEPENDENT_AMBULATORY_CARE_PROVIDER_SITE_OTHER): Payer: 59 | Admitting: Adult Health

## 2021-02-06 ENCOUNTER — Other Ambulatory Visit: Payer: Self-pay

## 2021-02-06 VITALS — BP 118/82 | HR 91 | Ht 68.5 in

## 2021-02-06 DIAGNOSIS — R454 Irritability and anger: Secondary | ICD-10-CM | POA: Insufficient documentation

## 2021-02-06 DIAGNOSIS — F419 Anxiety disorder, unspecified: Secondary | ICD-10-CM

## 2021-02-06 DIAGNOSIS — F32A Depression, unspecified: Secondary | ICD-10-CM

## 2021-02-06 MED ORDER — SERTRALINE HCL 50 MG PO TABS: 50.0000 mg | ORAL_TABLET | Freq: Every day | ORAL | 3 refills | Status: DC

## 2021-02-06 MED ORDER — HYDROXYZINE HCL 25 MG PO TABS: ORAL_TABLET | ORAL | 3 refills | Status: DC

## 2021-02-06 NOTE — Progress Notes (Signed)
  Subjective:     Patient ID: [name], female   DOB: 11/24/1975, 45 y.o.   MRN: [id number]  HPI [name] is a 45 year old white female, married, [id number] in for feeling angry, she stopped buspar and paxil about a month ago, felt it was not helping. PCP is Dr Lilyan Punt.   Review of Systems Does not sleep well Feels angry +anxiety and depression Reviewed past medical,surgical, social and family history. Reviewed medications and allergies.     Objective:   Physical Exam BP 118/82 (BP Location: Left Arm, Patient Position: Sitting, Cuff Size: Large)   Pulse 91   Ht 5' 8.5" (1.74 m)   LMP 02/03/2021   BMI 23.97 kg/m  Skin warm and dry.  Lungs: clear to ausculation bilaterally. Cardiovascular: regular rate and rhythm.    Fall risk is low Depression screen [facility] 2/9 02/06/2021 02/03/2019 11/22/2018  Decreased Interest 1 1 2   Down, Depressed, Hopeless 1 1 1   PHQ - 2 Score 2 2 3   Altered sleeping 2 3 3   Tired, decreased energy 2 0 1  Change in appetite 1 0 0  Feeling bad or failure about yourself  0 0 1  Trouble concentrating 2 3 3   Moving slowly or fidgety/restless 2 1 2   Suicidal thoughts 0 0 0  PHQ-9 Score 11 9 13   Difficult doing work/chores Very difficult - -    GAD 7 : Generalized Anxiety Score 02/06/2021  Nervous, Anxious, on Edge 2  Control/stop worrying 2  Worry too much - different things 2  Trouble relaxing 2  Restless 2  Easily annoyed or irritable 2  Afraid - awful might happen 2  Total GAD 7 Score 14  Anxiety Difficulty Somewhat difficult      [facility] - 02/06/21 1421       Pregnancy Intention Screening   Does the patient want to become pregnant in the next year? No    Does the patient's partner want to become pregnant in the next year? No    Would the patient like to discuss contraceptive options today? No      Contraception Wrap Up   Current Method Female Sterilization    End Method Female Sterilization    Contraception Counseling Provided No              Assessment:     1. Anxiety and depression Will rx zoloft and refill vistaril Meds ordered this encounter  Medications   sertraline (ZOLOFT) 50 MG tablet    Sig: Take 1 tablet (50 mg total) by mouth daily.    Dispense:  30 tablet    Refill:  3    Order Specific Question:   Supervising Provider    Answer:   , LUTHER H [[id number]]   hydrOXYzine (ATARAX/VISTARIL) 25 MG tablet    Sig: TAKE (1) TABLET BY MOUTH AT BEDTIME AS NEEDED, and every 8 hours prn anxiety    Dispense:  90 tablet    Refill:  3    Order Specific Question:   Supervising Provider    Answer:   , LUTHER H [[id number]]    2. Feeling angry   Will rx zoloft  Plan:    Follow up as scheduled for pap and physical 02/25/21

## 2021-02-25 ENCOUNTER — Other Ambulatory Visit (HOSPITAL_COMMUNITY)
Admission: RE | Admit: 2021-02-25 | Discharge: 2021-02-25 | Disposition: A | Discharge status: Home / Self Care | Payer: 59 | Source: Ambulatory Visit | Attending: Adult Health | Admitting: Adult Health

## 2021-02-25 ENCOUNTER — Encounter: Payer: Self-pay | Admitting: Adult Health

## 2021-02-25 ENCOUNTER — Ambulatory Visit (INDEPENDENT_AMBULATORY_CARE_PROVIDER_SITE_OTHER): Payer: 59 | Admitting: Adult Health

## 2021-02-25 ENCOUNTER — Other Ambulatory Visit: Payer: Self-pay

## 2021-02-25 VITALS — BP 133/80 | HR 75 | Ht 68.5 in

## 2021-02-25 DIAGNOSIS — Z01419 Encounter for gynecological examination (general) (routine) without abnormal findings: Secondary | ICD-10-CM | POA: Diagnosis present

## 2021-02-25 DIAGNOSIS — N921 Excessive and frequent menstruation with irregular cycle: Secondary | ICD-10-CM | POA: Insufficient documentation

## 2021-02-25 DIAGNOSIS — Z1211 Encounter for screening for malignant neoplasm of colon: Secondary | ICD-10-CM

## 2021-02-25 DIAGNOSIS — N941 Unspecified dyspareunia: Secondary | ICD-10-CM | POA: Insufficient documentation

## 2021-02-25 DIAGNOSIS — F32A Depression, unspecified: Secondary | ICD-10-CM

## 2021-02-25 DIAGNOSIS — F419 Anxiety disorder, unspecified: Secondary | ICD-10-CM

## 2021-02-25 LAB — POCT URINALYSIS DIPSTICK
Glucose, UA: NEGATIVE
Leukocytes, UA: NEGATIVE
Nitrite, UA: NEGATIVE
Protein, UA: NEGATIVE

## 2021-02-25 LAB — HEMOCCULT GUIAC POC 1CARD (OFFICE): Fecal Occult Blood, POC: NEGATIVE

## 2021-02-25 NOTE — Progress Notes (Signed)
Patient ID: [name], female   DOB: Aug 07, 1975, 45 y.o.   MRN: [id number] History of Present Illness: [name] is a 45 year old white female,married, [id number] in for a well woman gyn exam and pap. She started on zoloft 50 mg 02/06/21 and can tell a difference. She is complaining of heavy periods, lasting 6 days then stops 2-3 days and bleeds again, and some pain with sex at times. PCP is [facility].   Current Medications, Allergies, Past Medical History, Past Surgical History, Family History and Social History were reviewed in [employer] record.     Review of Systems: Patient denies any headaches, hearing loss, fatigue, blurred vision, shortness of breath, chest pain, abdominal pain, problems with bowel movements, urination. No joint pain or mood swings.  See HPI for positives.   Physical Exam:BP 133/80 (BP Location: Left Arm, Patient Position: Sitting, Cuff Size: Normal)   Pulse 75   Ht 5' 8.5" (1.74 m)   LMP 02/03/2021   BMI 23.97 kg/m  urine dipstick negative. General:  Well developed, well nourished, no acute distress Skin:  Warm and dry,has tattoos Neck:  Midline trachea, normal thyroid, good ROM, no lymphadenopathy Lungs; Clear to auscultation bilaterally Breast:  No dominant palpable mass, retraction, or nipple discharge Cardiovascular: Regular rate and rhythm Abdomen:  Soft, non tender, no hepatosplenomegaly Pelvic:  External genitalia is normal in appearance, no lesions.  The vagina is normal in appearance. Urethra has no lesions or masses. The cervix is bulbous. Pap with HR HPV genotyping performed. Uterus is felt to be normal size, shape, and contour.  No adnexal masses or tenderness noted.Bladder is non tender, no masses felt. Rectal: Good sphincter tone, no polyps, or hemorrhoids felt.  Hemoccult negative. Extremities/musculoskeletal:  No swelling or varicosities noted, no clubbing or cyanosis Psych:  No mood changes, alert and cooperative,seems  happy AA is 1 Fall risk is low Depression screen [facility] 2/9 02/25/2021 02/06/2021 02/03/2019  Decreased Interest 1 1 1   Down, Depressed, Hopeless - 1 1  PHQ - 2 Score 1 2 2   Altered sleeping 3 2 3   Tired, decreased energy 1 2 0  Change in appetite 0 1 0  Feeling bad or failure about yourself  2 0 0  Trouble concentrating 2 2 3   Moving slowly or fidgety/restless 0 2 1  Suicidal thoughts 0 0 0  PHQ-9 Score 9 11 9   Difficult doing work/chores - Very difficult -    GAD 7 : Generalized Anxiety Score 02/25/2021 02/06/2021  Nervous, Anxious, on Edge 2 2  Control/stop worrying 1 2  Worry too much - different things 1 2  Trouble relaxing 2 2  Restless 2 2  Easily annoyed or irritable 2 2  Afraid - awful might happen 1 2  Total GAD 7 Score 11 14  Anxiety Difficulty - Somewhat difficult    She started zoloft 02/06/21.  [facility] - 02/25/21 1424       Pregnancy Intention Screening   Does the patient want to become pregnant in the next year? No    Does the patient's partner want to become pregnant in the next year? No    Would the patient like to discuss contraceptive options today? No      Contraception Wrap Up   Current Method Female Sterilization    End Method Female Sterilization    Contraception Counseling Provided No            Examination chaperoned by young LPN  Impression  and Plan: 1. Encounter for gynecological examination with Papanicolaou smear of cervix Pap sent Physical in 1 year Pap in 3 if normal Mammogram year;y  2. Anxiety and depression Continue zoloft 50 mg has refills, will follow up when has [place]  3. Encounter for screening fecal occult blood testing   4. Menorrhagia with irregular cycle Will get pelvic US and see me after in about 3 weeks  Review handout on ablation  5. Dyspareunia, female Change positions

## 2021-02-27 LAB — CYTOLOGY - PAP
Adequacy: ABSENT
Comment: NEGATIVE
Diagnosis: NEGATIVE
High risk HPV: NEGATIVE

## 2021-03-13 ENCOUNTER — Other Ambulatory Visit: Payer: Self-pay | Admitting: Adult Health

## 2021-03-13 MED ORDER — SULFAMETHOXAZOLE-TRIMETHOPRIM 800-160 MG PO TABS: 1.0000 | ORAL_TABLET | Freq: Two times a day (BID) | ORAL | 0 refills | Status: DC

## 2021-03-13 NOTE — Progress Notes (Signed)
Will rx septra ds  

## 2021-03-20 ENCOUNTER — Other Ambulatory Visit: Payer: 59

## 2021-03-27 ENCOUNTER — Other Ambulatory Visit: Payer: 59

## 2021-04-18 ENCOUNTER — Other Ambulatory Visit: Payer: 59

## 2021-05-13 ENCOUNTER — Other Ambulatory Visit: Payer: Self-pay | Admitting: Adult Health

## 2021-05-13 MED ORDER — FLUCONAZOLE 150 MG PO TABS: ORAL_TABLET | ORAL | 1 refills | Status: DC

## 2021-05-13 NOTE — Progress Notes (Signed)
Rx diflucan.  

## 2021-05-16 ENCOUNTER — Ambulatory Visit (INDEPENDENT_AMBULATORY_CARE_PROVIDER_SITE_OTHER): Payer: 59

## 2021-05-16 ENCOUNTER — Other Ambulatory Visit: Payer: Self-pay

## 2021-05-16 DIAGNOSIS — N921 Excessive and frequent menstruation with irregular cycle: Secondary | ICD-10-CM

## 2021-05-16 NOTE — Progress Notes (Signed)
PELVIC US TA/TV: homogeneous retroverted uterus,WNL,EEC 8 mm,normal ovaries,ovaries appear mobile,no free fluid

## 2021-05-20 ENCOUNTER — Telehealth: Payer: Self-pay | Admitting: Adult Health

## 2021-05-20 NOTE — Telephone Encounter (Signed)
Pt aware [place] normal. If wants ablation just call and make appt

## 2021-05-29 ENCOUNTER — Telehealth: Payer: 59 | Admitting: Nurse Practitioner

## 2021-05-29 DIAGNOSIS — R051 Acute cough: Secondary | ICD-10-CM | POA: Diagnosis not present

## 2021-05-29 MED ORDER — BENZONATATE 100 MG PO CAPS: 100.0000 mg | ORAL_CAPSULE | Freq: Two times a day (BID) | ORAL | 0 refills | Status: DC | PRN

## 2021-05-29 NOTE — Progress Notes (Signed)
We are sorry that you are not feeling well.  Here is how we plan to help!  Based on your presentation I believe you most likely have A cough due to a virus.  This is called viral bronchitis and is best treated by rest, plenty of fluids and control of the cough.  You may use Ibuprofen or Tylenol as directed to help your symptoms.     In addition you may use A prescription cough medication called Tessalon Perles 100mg . You may take 1-2 capsules every 8 hours as needed for your cough.  You may want to be tested for covid or flu  From your responses in the eVisit questionnaire you describe inflammation in the upper respiratory tract which is causing a significant cough.  This is commonly called Bronchitis and has four common causes:   Allergies Viral Infections Acid Reflux Bacterial Infection Allergies, viruses and acid reflux are treated by controlling symptoms or eliminating the cause. An example might be a cough caused by taking certain blood pressure medications. You stop the cough by changing the medication. Another example might be a cough caused by acid reflux. Controlling the reflux helps control the cough.  USE OF BRONCHODILATOR ("RESCUE") INHALERS: There is a risk from using your bronchodilator too frequently.  The risk is that over-reliance on a medication which only relaxes the muscles surrounding the breathing tubes can reduce the effectiveness of medications prescribed to reduce swelling and congestion of the tubes themselves.  Although you feel brief relief from the bronchodilator inhaler, your asthma may actually be worsening with the tubes becoming more swollen and filled with mucus.  This can delay other crucial treatments, such as oral steroid medications. If you need to use a bronchodilator inhaler daily, several times per day, you should discuss this with your provider.  There are probably better treatments that could be used to keep your asthma under control.     HOME CARE Only  take medications as instructed by your medical team. Complete the entire course of an antibiotic. Drink plenty of fluids and get plenty of rest. Avoid close contacts especially the very young and the elderly Cover your mouth if you cough or cough into your sleeve. Always remember to wash your hands A steam or ultrasonic humidifier can help congestion.   GET HELP RIGHT AWAY IF: You develop worsening fever. You become short of breath You cough up blood. Your symptoms persist after you have completed your treatment plan MAKE SURE YOU  Understand these instructions. Will watch your condition. Will get help right away if you are not doing well or get worse.    Thank you for choosing an e-visit.  Your e-visit answers were reviewed by a board certified advanced clinical practitioner to complete your personal care plan. Depending upon the condition, your plan could have included both over the counter or prescription medications.  Please review your pharmacy choice. Make sure the pharmacy is open so you can pick up prescription now. If there is a problem, you may contact your provider through and have the prescription routed to another pharmacy.  Your safety is important to [employer]. If you have drug allergies check your prescription carefully.   For the next 24 hours you can use MyChart to ask questions about today's visit, request a non-urgent call back, or ask for a work or school excuse. You will get an email in the next two days asking about your experience. I hope that your e-visit has been valuable  and will speed your recovery.  5-10 minutes spent reviewing and documenting in chart.  

## 2021-07-26 ENCOUNTER — Emergency Department (HOSPITAL_COMMUNITY): Payer: 59

## 2021-07-26 ENCOUNTER — Encounter (HOSPITAL_COMMUNITY): Payer: Self-pay | Admitting: *Deleted

## 2021-07-26 ENCOUNTER — Other Ambulatory Visit: Payer: Self-pay

## 2021-07-26 ENCOUNTER — Emergency Department (HOSPITAL_COMMUNITY)
Admission: EM | Admit: 2021-07-26 | Discharge: 2021-07-26 | Disposition: A | Discharge status: Home / Self Care | Payer: 59 | Attending: Emergency Medicine | Admitting: Emergency Medicine

## 2021-07-26 DIAGNOSIS — M25561 Pain in right knee: Secondary | ICD-10-CM | POA: Insufficient documentation

## 2021-07-26 MED ORDER — NAPROXEN 500 MG PO TABS: 500.0000 mg | ORAL_TABLET | Freq: Two times a day (BID) | ORAL | 0 refills | Status: DC

## 2021-07-26 NOTE — ED Notes (Signed)
Pt spouse came to nurses desk and asked how much longer the wait will be. Informed him that I do not know an exact wait time. Spouse stated "do we need to leave and go somewhere else because this hospital has the worst reputation for this". Explained to spouse that the wait time would be similar at other locations, and our providers will see the pt as soon as they can.

## 2021-07-26 NOTE — Discharge Instructions (Addendum)
You were seen here today for evaluation of right knee pain.  Your x-ray shows some mild osteoarthritis.  You likely need to follow-up with your orthopedic surgeon, Dr. Margarita Rana.  I prescribed Naprosyn, an anti-inflammatory, to take as needed.  If you have any concern, new or worsening symptoms, please return to the nearest emergency department for evaluation.

## 2021-07-26 NOTE — ED Notes (Signed)
Patient transported to X-ray 

## 2021-07-26 NOTE — ED Provider Notes (Signed)
[facility] EMERGENCY DEPARTMENT Provider Note   CSN: [id number] Arrival date & time: 07/26/21  [id number]     History Chief Complaint  Patient presents with   Knee Pain    [name] is a 46 y.o. female presented to the emergency department for evaluation of right knee pain for the past 2 weeks.  Patient reports she saw and palpated a abnormal area on the lateral side of her right knee yesterday.  Denies any injury, fall, trauma, or recent MVC.  Patient ports she had surgery on her knee 2 years ago with a patellar fracture by Dr. Edmonia Lynch.  She denies any swelling or pain to the calf or leg.  Denies any numbness tingling or weakness.  Medical problems include anxiety and depression.  Denies any other orthopedic surgeries to that knee.  On sertraline.  No known drug allergies.  Denies any tobacco, EtOH, illicit drug use.   Knee Pain Associated symptoms: no back pain and no fever       Home Medications Prior to Admission medications   Medication Sig Start Date End Date Taking? Authorizing Provider  naproxen (NAPROSYN) 500 MG tablet Take 1 tablet (500 mg total) by mouth 2 (two) times daily. 07/26/21  Yes Sherrell Puller, PA-C  sulfamethoxazole-trimethoprim (BACTRIM DS) 800-160 MG tablet Take 1 tablet by mouth 2 (two) times daily. Take 1 bid 03/13/21   Derrek Monaco A, NP  benzonatate (TESSALON) 100 MG capsule Take 1 capsule (100 mg total) by mouth 2 (two) times daily as needed for cough. 05/29/21   Chevis Pretty, FNP  fluconazole (DIFLUCAN) 150 MG tablet Take 1 now and 1 in 3 days 05/13/21   Derrek Monaco A, NP  hydrOXYzine (ATARAX/VISTARIL) 25 MG tablet TAKE (1) TABLET BY MOUTH AT BEDTIME AS NEEDED, and every 8 hours prn anxiety 02/06/21   Estill Dooms, NP  sertraline (ZOLOFT) 50 MG tablet Take 1 tablet (50 mg total) by mouth daily. 02/06/21   Estill Dooms, NP      Allergies    Patient has no known allergies.    Review of Systems   Review of Systems   Constitutional:  Negative for chills and fever.  HENT:  Negative for ear pain and sore throat.   Eyes:  Negative for pain and visual disturbance.  Respiratory:  Negative for cough and shortness of breath.   Cardiovascular:  Negative for chest pain and palpitations.  Gastrointestinal:  Negative for abdominal pain and vomiting.  Genitourinary:  Negative for dysuria and hematuria.  Musculoskeletal:  Positive for arthralgias and joint swelling. Negative for back pain.  Skin:  Negative for color change and rash.  Neurological:  Negative for seizures and syncope.  All other systems reviewed and are negative.  Physical Exam Updated Vital Signs BP 134/74 (BP Location: Right Arm)    Pulse 93    Temp 98 F (36.7 C) (Oral)    Resp 18    Ht 5' 8.5" (1.74 m)    Wt 72.6 kg    LMP 07/14/2021    SpO2 95%    BMI 23.97 kg/m  Physical Exam Vitals and nursing note reviewed.  Constitutional:      General: She is not in acute distress.    Appearance: Normal appearance. She is not toxic-appearing.  Eyes:     General: No scleral icterus. Pulmonary:     Effort: Pulmonary effort is normal. No respiratory distress.  Musculoskeletal:        General: Tenderness present. No  swelling.     Right lower leg: No edema.     Left lower leg: No edema.     Comments: Faint surgical scar to the anterior knee. No other overlying skin changes, other than tattoos present. Tenderness along the lateral aspect of the right knee. Crepitus palpated on ROM of the right knee, none on the left. No obvious deformity. DP and PT pulses intact. Compartments soft. Sensation intact bilaterally. No obvious deformity.   Skin:    General: Skin is dry.     Findings: No rash.  Neurological:     General: No focal deficit present.     Mental Status: She is alert. Mental status is at baseline.  Psychiatric:        Mood and Affect: Mood normal.    ED Results / Procedures / Treatments   Labs (all labs ordered are listed, but only abnormal  results are displayed) Labs Reviewed - No data to display  EKG None  Radiology DG Knee Complete 4 Views Right  Result Date: 07/26/2021 CLINICAL DATA:  Knee pain.  History of patellar surgery 08/2019. EXAM: RIGHT KNEE - COMPLETE 4+ VIEW COMPARISON:  08/31/2019 FINDINGS: There are 2 new vertical oriented screws from inferior approach traversing the patella. There is marked interval improvement in now near anatomic alignment of the patella, with resolution of the prior transverse dimension fracture and craniocaudal diastasis. Mild patellofemoral joint space narrowing with likely mild subchondral degenerative cystic change at the mid height. No joint effusion. No acute fracture or dislocation. IMPRESSION: Interval ORIF of the patella with improved, now near anatomic alignment. Mild patellofemoral osteoarthritis. Electronically Signed   By: Yvonne Kendall M.D.   On: 07/26/2021 13:30    Procedures Procedures  Hemodynamically stable.  Medications Ordered in ED Medications - No data to display  ED Course/ Medical Decision Making/ A&P                           Medical Decision Making  46 year old female presents emergency department for evaluation of right knee pain.  Differential diagnosis includes but is not limited to fracture, dislocation, misplaced screw, strain, sprain.  Vital signs stable.  Afebrile normotensive.  On exam, she has some tenderness to the lateral aspect of her right knee with no overlying skin changes other than tattoos present.  No increased swelling.  DP and PT pulses intact bilaterally.  Sensation intact.  Compartments are soft.  Crepitus is palpated over the knee with range of motion.  X-ray shows interval ORIF of the patella with improved and now near anatomic alignment with mild patellofemoral osteoarthritis.  Recommended the patient follow-up with her orthopedic surgeon as she may need an MRI and reevaluation by him.  We will place patient on naproxen.  Return precautions  given.  Patient agrees to plan.  Patient is stable and being discharged home in good condition.  Final Clinical Impression(s) / ED Diagnoses Final diagnoses:  Acute pain of right knee    Rx / DC Orders ED Discharge Orders          Ordered    naproxen (NAPROSYN) 500 MG tablet  2 times daily        07/26/21 1434              Sherrell Puller, PA-C 07/26/21 1437    Milton Ferguson, MD 07/27/21 [phone]

## 2021-07-26 NOTE — ED Triage Notes (Signed)
Pt c/o right knee pain that started 2 weeks ago. Pt noticed last night her knee looked different and felt like a screw had come loose. Pt had knee surgery 2 years ago and called her surgeon but they said since it had been 2 years from the surgery they wouldn't have anything to do with it. Denies injury.

## 2021-08-08 ENCOUNTER — Other Ambulatory Visit: Payer: Self-pay

## 2021-08-08 ENCOUNTER — Ambulatory Visit (INDEPENDENT_AMBULATORY_CARE_PROVIDER_SITE_OTHER): Payer: 59 | Admitting: Orthopedic Surgery

## 2021-08-08 ENCOUNTER — Ambulatory Visit: Payer: 59

## 2021-08-08 ENCOUNTER — Encounter: Payer: Self-pay | Admitting: Orthopedic Surgery

## 2021-08-08 VITALS — BP 135/88 | HR 79 | Ht 68.5 in | Wt 194.0 lb

## 2021-08-08 DIAGNOSIS — M25561 Pain in right knee: Secondary | ICD-10-CM | POA: Diagnosis not present

## 2021-08-08 DIAGNOSIS — M7631 Iliotibial band syndrome, right leg: Secondary | ICD-10-CM

## 2021-08-08 MED ORDER — MELOXICAM 7.5 MG PO TABS: 7.5000 mg | ORAL_TABLET | Freq: Every day | ORAL | 5 refills | Status: DC

## 2021-08-08 NOTE — Progress Notes (Signed)
Chief Complaint  Patient presents with   Knee Pain    History of patella fracture 2 years ago pain improved now has something "sticking out" and has aching     HPI: 46 year old female had surgery with Dr. Percell Miller for patella fracture sustained after falling on ice.  She says she recovered well the surgery went well.  She presents now with aching pain anterior right knee and a knot at the lateral superior to midportion of the patella  She tried Naprosyn for couple of days did not get any relief takes Tylenol as well.  Past Medical History:  Diagnosis Date   Anxiety    Chronic constipation    Complication of anesthesia    PONV   Degenerative disc disease    L1 and L5 per pt, no meds   Depression    Right patella fracture    fell 08-31-2019   Wears glasses     BP 135/88    Pulse 79    Ht 5' 8.5" (1.74 m)    Wt 194 lb (88 kg)    LMP 07/14/2021    BMI 29.07 kg/m    General appearance: Well-developed well-nourished no gross deformities  Cardiovascular normal pulse and perfusion normal color without edema  Neurologically no sensation loss or deficits or pathologic reflexes  Psychological: Awake alert and oriented x3 mood and affect normal  Skin no lacerations or ulcerations no nodularity no palpable masses, no erythema or nodularity  Musculoskeletal: Exam of the knee shows there is no effusion there is an area that appears to be very firm it moves with the patella its outside the normal contours of the kneecap  There is no instability in the knee there is no hypersensitivity to the scar which healed well  Imaging an outside image was taken on 13 January of this year it shows 2 screws with a washer with reduction of the fracture  New x-ray was taken with a marker and on that x-ray we see bone spur growing off the superolateral edge of the patella unrelated to the hardware    A/P  Iliotibial band tendinitis  Recommend to ambulate on flat surface  Change Naprosyn to  different NSAID  Therapy for stretching  Follow-up as needed  Meds ordered this encounter  Medications   meloxicam (MOBIC) 7.5 MG tablet    Sig: Take 1 tablet (7.5 mg total) by mouth daily.    Dispense:  30 tablet    Refill:  5

## 2021-08-08 NOTE — Patient Instructions (Signed)
Physical therapy has been ordered for you at [place]. They should call you to schedule, [phone] is the phone number to call, if you want to call to schedule.   

## 2021-09-13 ENCOUNTER — Other Ambulatory Visit: Payer: Self-pay

## 2021-09-13 ENCOUNTER — Ambulatory Visit
Admission: EM | Admit: 2021-09-13 | Discharge: 2021-09-13 | Disposition: A | Discharge status: Home / Self Care | Payer: 59 | Attending: Urgent Care | Admitting: Urgent Care

## 2021-09-13 ENCOUNTER — Encounter: Payer: Self-pay | Admitting: Emergency Medicine

## 2021-09-13 DIAGNOSIS — R070 Pain in throat: Secondary | ICD-10-CM | POA: Insufficient documentation

## 2021-09-13 DIAGNOSIS — J069 Acute upper respiratory infection, unspecified: Secondary | ICD-10-CM | POA: Insufficient documentation

## 2021-09-13 DIAGNOSIS — H9203 Otalgia, bilateral: Secondary | ICD-10-CM | POA: Diagnosis not present

## 2021-09-13 DIAGNOSIS — R052 Subacute cough: Secondary | ICD-10-CM | POA: Diagnosis not present

## 2021-09-13 LAB — POCT RAPID STREP A (OFFICE): Rapid Strep A Screen: NEGATIVE

## 2021-09-13 MED ORDER — BENZONATATE 100 MG PO CAPS: 100.0000 mg | ORAL_CAPSULE | Freq: Three times a day (TID) | ORAL | 0 refills | Status: DC | PRN

## 2021-09-13 MED ORDER — LEVOCETIRIZINE DIHYDROCHLORIDE 5 MG PO TABS: 5.0000 mg | ORAL_TABLET | Freq: Every evening | ORAL | 0 refills | Status: DC

## 2021-09-13 MED ORDER — PSEUDOEPHEDRINE HCL 60 MG PO TABS: 60.0000 mg | ORAL_TABLET | Freq: Three times a day (TID) | ORAL | 0 refills | Status: DC | PRN

## 2021-09-13 MED ORDER — PROMETHAZINE-DM 6.25-15 MG/5ML PO SYRP: 5.0000 mL | ORAL_SOLUTION | Freq: Every evening | ORAL | 0 refills | Status: DC | PRN

## 2021-09-13 NOTE — ED Provider Notes (Signed)
?[facility] ? ? ?MRN: [id number] DOB: Dec 14, 1975 ? ?Subjective:  ? ?[name] is a 46 y.o. female presenting for 1 day history of acute onset throat pain, sinus drainage, bilateral ear pain, coughing, chest tightness.  Patient has had multiple sick contacts, mostly kids.  No history of smoking.  She is not an asthmatic patient.  Does not want to be checked for COVID or flu. ? ?No current facility-administered medications for this encounter. ? ?Current Outpatient Medications:  ?  hydrOXYzine (ATARAX/VISTARIL) 25 MG tablet, TAKE (1) TABLET BY MOUTH AT BEDTIME AS NEEDED, and every 8 hours prn anxiety, Disp: 90 tablet, Rfl: 3 ?  meloxicam (MOBIC) 7.5 MG tablet, Take 1 tablet (7.5 mg total) by mouth daily., Disp: 30 tablet, Rfl: 5 ?  sertraline (ZOLOFT) 50 MG tablet, Take 1 tablet (50 mg total) by mouth daily., Disp: 30 tablet, Rfl: 3  ? ?No Known Allergies ? ?Past Medical History:  ?Diagnosis Date  ? Anxiety   ? Chronic constipation   ? Complication of anesthesia   ? PONV  ? Degenerative disc disease   ? L1 and L5 per pt, no meds  ? Depression   ? Right patella fracture   ? fell 08-31-2019  ? Wears glasses   ?  ? ?Past Surgical History:  ?Procedure Laterality Date  ? CESAREAN SECTION    ? x 3  ? CHOLECYSTECTOMY    ? DILATION AND CURETTAGE OF UTERUS    ? ORIF PATELLA Right 09/06/2019  ? Procedure: OPEN REDUCTION INTERNAL (ORIF) FIXATION PATELLA;  Surgeon: Sheral Apley, MD;  Location: [facility] [place];  Service: Orthopedics;  Laterality: Right;  ? TUBAL LIGATION    ? WISDOM TOOTH EXTRACTION    ? ? ?Family History  ?Problem Relation Age of Onset  ? Cancer Maternal Grandmother   ? Diabetes Maternal Grandmother   ? Anesthesia problems Neg Hx   ? ? ?Social History  ? ?Tobacco Use  ? Smoking status: Never  ? Smokeless tobacco: Never  ?Vaping Use  ? Vaping Use: Never used  ?Substance Use Topics  ? Alcohol use: No  ? Drug use: Never  ? ? ?ROS ? ? ?Objective:  ? ?Vitals: ?BP 123/83 (BP  Location: Right Arm)   Pulse 80   Temp 98 ?F (36.7 ?C) (Oral)   Resp 18   Ht 5\' 8"  (1.727 m)   Wt 155 lb (70.3 kg)   LMP 09/01/2021 (Approximate)   SpO2 97%   BMI 23.57 kg/m?  ? ?Physical Exam ?Constitutional:   ?   General: She is not in acute distress. ?   Appearance: Normal appearance. She is well-developed and normal weight. She is not ill-appearing, toxic-appearing or diaphoretic.  ?HENT:  ?   Head: Normocephalic and atraumatic.  ?   Right Ear: Ear canal and external ear normal. No drainage, swelling or tenderness. No middle ear effusion. There is no impacted cerumen. Tympanic membrane is not erythematous.  ?   Left Ear: Ear canal and external ear normal. No drainage, swelling or tenderness.  No middle ear effusion. There is no impacted cerumen. Tympanic membrane is not erythematous.  ?   Ears:  ?   Comments: TMs opacified bilaterally. ?   Nose: Nose normal. No congestion or rhinorrhea.  ?   Mouth/Throat:  ?   Mouth: Mucous membranes are moist. No oral lesions.  ?   Pharynx: No pharyngeal swelling, oropharyngeal exudate, posterior oropharyngeal erythema or uvula swelling.  ?  Tonsils: No tonsillar exudate or tonsillar abscesses.  ?   Comments: Thick streaks of postnasal drainage overlying pharynx. ?Eyes:  ?   General: No scleral icterus.    ?   Right eye: No discharge.     ?   Left eye: No discharge.  ?   Extraocular Movements: Extraocular movements intact.  ?   Right eye: Normal extraocular motion.  ?   Left eye: Normal extraocular motion.  ?   Conjunctiva/sclera: Conjunctivae normal.  ?Cardiovascular:  ?   Rate and Rhythm: Normal rate.  ?   Heart sounds: No murmur heard. ?  No friction rub. No gallop.  ?Pulmonary:  ?   Effort: Pulmonary effort is normal. No respiratory distress.  ?   Breath sounds: No stridor. No wheezing, rhonchi or rales.  ?Chest:  ?   Chest wall: No tenderness.  ?Musculoskeletal:  ?   Cervical back: Normal range of motion and neck supple.  ?Lymphadenopathy:  ?   Cervical: No  cervical adenopathy.  ?Skin: ?   General: Skin is warm and dry.  ?Neurological:  ?   General: No focal deficit present.  ?   Mental Status: She is alert and oriented to person, place, and time.  ?Psychiatric:     ?   Mood and Affect: Mood normal.     ?   Behavior: Behavior normal.  ? ? ?Results for orders placed or performed during the hospital encounter of 09/13/21 (from the past 24 hour(s))  ?POCT rapid strep A     Status: None  ? Collection Time: 09/13/21  9:48 AM  ?Result Value Ref Range  ? Rapid Strep A Screen Negative Negative  ? ? ?Assessment and Plan :  ? ?PDMP not reviewed this encounter. ? ?1. Viral upper respiratory infection   ?2. Throat pain   ?3. Acute ear pain, bilateral   ?4. Subacute cough   ? ?Patient declined COVID test. Deferred imaging given clear cardiopulmonary exam, hemodynamically stable vital signs. Suspect viral URI, viral syndrome. Physical exam findings reassuring and vital signs stable for discharge. Advised supportive care, offered symptomatic relief. Counseled patient on potential for adverse effects with medications prescribed/recommended today, ER and return-to-clinic precautions discussed, patient verbalized understanding.  ? ?  ?Wallis Bamberg, PA-C ?09/13/21 1000 ? ?

## 2021-09-13 NOTE — ED Triage Notes (Signed)
Pt reports sore throat since last night. Denies any known fever or other symptoms.  ?

## 2021-09-16 LAB — CULTURE, GROUP A STREP (THRC)

## 2021-10-11 ENCOUNTER — Other Ambulatory Visit: Payer: Self-pay | Admitting: Adult Health

## 2021-10-11 MED ORDER — MOMETASONE FUROATE 0.1 % EX OINT: TOPICAL_OINTMENT | Freq: Every day | CUTANEOUS | 0 refills | Status: DC

## 2021-10-11 NOTE — Progress Notes (Signed)
Will rx elocon for hands ?

## 2021-10-12 ENCOUNTER — Other Ambulatory Visit: Payer: Self-pay | Admitting: Adult Health

## 2021-10-22 ENCOUNTER — Other Ambulatory Visit: Payer: Self-pay | Admitting: Adult Health

## 2021-10-22 ENCOUNTER — Telehealth: Payer: Self-pay

## 2021-10-22 NOTE — Telephone Encounter (Signed)
PT CALLED AND STATED THAT SHE NEEDS A REFILL ON HER ZOLOFT. ?

## 2021-10-31 ENCOUNTER — Encounter: Payer: Self-pay | Admitting: Emergency Medicine

## 2021-10-31 ENCOUNTER — Ambulatory Visit
Admission: EM | Admit: 2021-10-31 | Discharge: 2021-10-31 | Disposition: A | Discharge status: Home / Self Care | Payer: 59 | Attending: Urgent Care | Admitting: Urgent Care

## 2021-10-31 DIAGNOSIS — E860 Dehydration: Secondary | ICD-10-CM

## 2021-10-31 DIAGNOSIS — M545 Low back pain, unspecified: Secondary | ICD-10-CM

## 2021-10-31 DIAGNOSIS — R1030 Lower abdominal pain, unspecified: Secondary | ICD-10-CM

## 2021-10-31 LAB — POCT URINALYSIS DIP (MANUAL ENTRY)
Bilirubin, UA: NEGATIVE
Glucose, UA: NEGATIVE mg/dL
Ketones, POC UA: NEGATIVE mg/dL
Leukocytes, UA: NEGATIVE
Nitrite, UA: NEGATIVE
Protein Ur, POC: NEGATIVE mg/dL
Spec Grav, UA: 1.02 (ref 1.010–1.025)
Urobilinogen, UA: 0.2 E.U./dL
pH, UA: 7 (ref 5.0–8.0)

## 2021-10-31 MED ORDER — NAPROXEN 500 MG PO TABS: 500.0000 mg | ORAL_TABLET | Freq: Two times a day (BID) | ORAL | 0 refills | Status: DC

## 2021-10-31 MED ORDER — TIZANIDINE HCL 4 MG PO TABS: 4.0000 mg | ORAL_TABLET | Freq: Every day | ORAL | 0 refills | Status: DC

## 2021-10-31 NOTE — ED Provider Notes (Signed)
?[facility] ? ? ?MRN: [id number] DOB: 11-03-1975 ? ?Subjective:  ? ?Zenda AlpersCindy L Littman is a 46 y.o. female presenting for several day history of acute onset recurrent low back pain over the right side.  No radicular symptoms.  No falls, trauma, numbness, tingling, weakness.  No heavy lifting or strenuous physical activities.  Patient has also had some intermittent lower abdominal pain when she urinates.  Hydrates with about 3 diet sodas daily.  Tries to drink water but is very consistent about her diet sodas.  Has a history of degenerative disc disease of the lumbar region, a herniated nucleus pulposus as seen on an MRI.  Does not have a back specialist.  No dysuria, urinary frequency, hematuria, history of renal stones. ? ?No current facility-administered medications for this encounter. ? ?Current Outpatient Medications:  ?  benzonatate (TESSALON) 100 MG capsule, Take 1-2 capsules (100-200 mg total) by mouth 3 (three) times daily as needed for cough., Disp: 60 capsule, Rfl: 0 ?  hydrOXYzine (ATARAX/VISTARIL) 25 MG tablet, TAKE (1) TABLET BY MOUTH AT BEDTIME AS NEEDED, and every 8 hours prn anxiety, Disp: 90 tablet, Rfl: 3 ?  levocetirizine (XYZAL) 5 MG tablet, Take 1 tablet (5 mg total) by mouth every evening., Disp: 30 tablet, Rfl: 0 ?  meloxicam (MOBIC) 7.5 MG tablet, Take 1 tablet (7.5 mg total) by mouth daily., Disp: 30 tablet, Rfl: 5 ?  mometasone (ELOCON) 0.1 % ointment, Apply topically daily., Disp: 45 g, Rfl: 0 ?  promethazine-dextromethorphan (PROMETHAZINE-DM) 6.25-15 MG/5ML syrup, Take 5 mLs by mouth at bedtime as needed for cough., Disp: 100 mL, Rfl: 0 ?  pseudoephedrine (SUDAFED) 60 MG tablet, Take 1 tablet (60 mg total) by mouth every 8 (eight) hours as needed for congestion., Disp: 30 tablet, Rfl: 0 ?  sertraline (ZOLOFT) 50 MG tablet, TAKE 1 TABLET BY MOUTH ONCE A DAY., Disp: 30 tablet, Rfl: 1  ? ?No Known Allergies ? ?Past Medical History:  ?Diagnosis Date  ? Anxiety   ? Chronic  constipation   ? Complication of anesthesia   ? PONV  ? Degenerative disc disease   ? L1 and L5 per pt, no meds  ? Depression   ? Right patella fracture   ? fell 08-31-2019  ? Wears glasses   ?  ? ?Past Surgical History:  ?Procedure Laterality Date  ? CESAREAN SECTION    ? x 3  ? CHOLECYSTECTOMY    ? DILATION AND CURETTAGE OF UTERUS    ? ORIF PATELLA Right 09/06/2019  ? Procedure: OPEN REDUCTION INTERNAL (ORIF) FIXATION PATELLA;  Surgeon: Sheral ApleyMurphy, Timothy D, MD;  [facility];  Service: Orthopedics;  Laterality: Right;  ? TUBAL LIGATION    ? WISDOM TOOTH EXTRACTION    ? ? ?Family History  ?Problem Relation Age of Onset  ? Cancer Maternal Grandmother   ? Diabetes Maternal Grandmother   ? Anesthesia problems Neg Hx   ? ? ?Social History  ? ?Tobacco Use  ? Smoking status: Never  ? Smokeless tobacco: Never  ?Vaping Use  ? Vaping Use: Never used  ?Substance Use Topics  ? Alcohol use: No  ? Drug use: Never  ? ? ?ROS ? ? ?Objective:  ? ?Vitals: ?BP 129/76 (BP Location: Right Arm)   Pulse 72   Temp 98 ?F (36.7 ?C) (Oral)   Resp 18   LMP 10/22/2021 (Exact Date)   SpO2 100%  ? ?Physical Exam ?Constitutional:   ?   General: She is not in  acute distress. ?   Appearance: Normal appearance. She is well-developed. She is not ill-appearing, toxic-appearing or diaphoretic.  ?HENT:  ?   Head: Normocephalic and atraumatic.  ?   Nose: Nose normal.  ?   Mouth/Throat:  ?   Mouth: Mucous membranes are moist.  ?Eyes:  ?   General: No scleral icterus.    ?   Right eye: No discharge.     ?   Left eye: No discharge.  ?   Extraocular Movements: Extraocular movements intact.  ?Cardiovascular:  ?   Rate and Rhythm: Normal rate.  ?Pulmonary:  ?   Effort: Pulmonary effort is normal.  ?Musculoskeletal:  ?   Comments: Full range of motion throughout.  Strength 5/5 for lower extremities.  Patient ambulates without any assistance at expected pace.  No ecchymosis, swelling, lacerations or abrasions.  Patient does have  paraspinal muscle tenderness along the right lumbar region of her back excluding the midline.  ?Skin: ?   General: Skin is warm and dry.  ?Neurological:  ?   General: No focal deficit present.  ?   Mental Status: She is alert and oriented to person, place, and time.  ?   Motor: No weakness.  ?   Coordination: Coordination normal.  ?   Gait: Gait normal.  ?   Deep Tendon Reflexes: Reflexes normal.  ?Psychiatric:     ?   Mood and Affect: Mood normal.     ?   Behavior: Behavior normal.     ?   Thought Content: Thought content normal.     ?   Judgment: Judgment normal.  ? ? ?Results for orders placed or performed during the hospital encounter of 10/31/21 (from the past 24 hour(s))  ?POCT urinalysis dipstick     Status: Abnormal  ? Collection Time: 10/31/21  9:54 AM  ?Result Value Ref Range  ? Color, UA yellow yellow  ? Clarity, UA hazy (A) clear  ? Glucose, UA negative negative mg/dL  ? Bilirubin, UA negative negative  ? Ketones, POC UA negative negative mg/dL  ? Spec Grav, UA 1.020 1.010 - 1.025  ? Blood, UA trace-intact (A) negative  ? pH, UA 7.0 5.0 - 8.0  ? Protein Ur, POC negative negative mg/dL  ? Urobilinogen, UA 0.2 0.2 or 1.0 E.U./dL  ? Nitrite, UA Negative Negative  ? Leukocytes, UA Negative Negative  ? ? ?Narrative  ?FINDINGS  ?CLINICAL DATA:  ?PAIN IN LEFT HIP AND LEFT LOWER EXTREMITY.  ?LUMBAR SPINE 2-3 VIEWS:  ?AP AND TWO LATERAL VIEWS WERE OBTAINED.  THE PATIENT HAD FIVE NON-RIB BEARING LUMBAR SEGMENTS.  NO  ?DISK SPACE NARROWING, PARS DEFECTS OR SPONDYLOLISTHESIS.  METALLIC CLIPS PRESENT IN THE RIGHT UPPER  ?QUADRANT, MOST LIKELY DUE TO PRIOR CHOLECYSTECTOMY.  ?IMPRESSION  ?UNREMARKABLE PLAIN FILMS OF THE LUMBAR SPINE.  ?MRI LUMBAR SPINE WITHOUT CONTRAST:  ?CONUS MEDULLARIS APPEARS NORMAL.  L1-2, L2-3 AND L3-4 LEVELS REVEAL NO HNP OR STENOSIS.  THERE IS  ?FACET HYPERTROPHY AT THOSE LEVELS, MAINLY AT L3-4 SECONDARY TO DEGENERATIVE FACET ARTHROPATHY.  ?AT L4-5 AND L5-S1, THERE IS ALSO FACET HYPERTROPHY AND  SOME LIGAMENTAL FLAVAL THICKENING.  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  ALSO AT L4-5, THERE IS A MODERATELY SMALL RIGHT  ?PARACENTRAL HNP, WHICH INDENTS THE THECAL SAC AND LIKELY IS AFFECTING THE RIGHT L5 NERVE ROOT.  ?SIMILARLY, AT L5-S1, THERE IS A CENTRAL AND RIGHT PARACENTRAL HNP.  THIS HNP IS MODERATE IN SIZE.  ?IT POSTERIORLY DISPLACES  THE RIGHT S1 NERVE ROOT.  HISTORY STATES THAT THE PATIENT HAS LEFT-SIDED  ?SYMPTOMS.  ARE THERE ANY CLINICAL FINDINGS TO SUGGEST RIGHT L5 AND /OR RIGHT S1 RADICULOPATHIES?  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  RIGHT PARACENTRAL HNP AT L4-5.  ? ? ?Assessment and Plan :  ? ?PDMP not reviewed this encounter. ? ?1. Acute right-sided low back pain without sciatica   ?2. Mild dehydration   ?3. Lower abdominal pain   ? ?Recommended eliminating diet sodas.  Hydrate better with plain water.  Low suspicion for an acute spinal injury, emergency.  Recommended conservative management with [place], naproxen, tizanidine.  Follow-up with back specialist given the history of the herniated nucleus pulposus. Counseled patient on potential for adverse effects with medications prescribed/recommended today, ER and return-to-clinic precautions discussed, patient verbalized understanding. ? ?  ?Wallis Bamberg, PA-C ?10/31/21 1019 ? ?

## 2021-10-31 NOTE — ED Triage Notes (Signed)
Pain on right side that radiates to left side.  Pain in lower abd after urinating.   ?

## 2021-11-04 ENCOUNTER — Ambulatory Visit: Payer: 59 | Admitting: Adult Health

## 2021-11-04 ENCOUNTER — Encounter: Payer: Self-pay | Admitting: Adult Health

## 2021-11-04 VITALS — BP 112/71 | HR 76 | Ht 68.0 in

## 2021-11-04 DIAGNOSIS — F32A Depression, unspecified: Secondary | ICD-10-CM | POA: Diagnosis not present

## 2021-11-04 DIAGNOSIS — F419 Anxiety disorder, unspecified: Secondary | ICD-10-CM | POA: Diagnosis not present

## 2021-11-04 DIAGNOSIS — G479 Sleep disorder, unspecified: Secondary | ICD-10-CM | POA: Diagnosis not present

## 2021-11-04 MED ORDER — SERTRALINE HCL 50 MG PO TABS: 50.0000 mg | ORAL_TABLET | Freq: Every day | ORAL | 1 refills | Status: DC

## 2021-11-04 MED ORDER — TRAZODONE HCL 50 MG PO TABS: 50.0000 mg | ORAL_TABLET | Freq: Every evening | ORAL | 3 refills | Status: DC | PRN

## 2021-11-04 NOTE — Progress Notes (Signed)
?Subjective:  ?  ? Patient ID: [name], female   DOB: 06-Oct-1975, 46 y.o.   MRN: [id number] ? ?HPI ?[name] is a 46 year old white female,married, [id number] in for follow up on Zoloft and she says it si working but she may not take daily. She is having sleep issues, has tried vistaril and melatonin and even Nyquil. ?She says Elocon has helped her hands a lot.  ?Lab Results  ?Component Value Date  ? DIAGPAP  02/25/2021  ?  - Negative for intraepithelial lesion or malignancy (NILM)  ? HPV NOT DETECTED 10/24/2016  ? HPVHIGH Negative 02/25/2021  ?  ?Review of Systems ?Feels less depressed and anxious with Zoloft ?+sleep disturbance ?Reviewed past medical,surgical, social and family history. Reviewed medications and allergies.  ?   ?Objective:  ? Physical Exam ?BP 112/71 (BP Location: Left Arm, Patient Position: Sitting, Cuff Size: Large)   Pulse 76   Ht 5\' 8"  (1.727 m)   LMP 10/22/2021 (Exact Date)   BMI 23.57 kg/m?   ?  Skin warm and dry. Lungs: clear to ausculation bilaterally. Cardiovascular: regular rate and rhythm.  ?Fall risk is low ? ?  11/04/2021  ?  1:55 PM 02/25/2021  ?  2:33 PM 02/06/2021  ?  2:23 PM  ?Depression screen PHQ 2/9  ?Decreased Interest 2 1 1   ?Down, Depressed, Hopeless 1  1  ?PHQ - 2 Score 3 1 2   ?Altered sleeping 3 3 2   ?Tired, decreased energy 2 1 2   ?Change in appetite 0 0 1  ?Feeling bad or failure about yourself  1 2 0  ?Trouble concentrating 2 2 2   ?Moving slowly or fidgety/restless 1 0 2  ?Suicidal thoughts 0 0 0  ?PHQ-9 Score 12 9 11   ?Difficult doing work/chores Somewhat difficult  Very difficult  ?  ? ?  11/04/2021  ?  1:58 PM 11/04/2021  ?  1:57 PM 02/25/2021  ?  2:33 PM 02/06/2021  ?  2:25 PM  ?GAD 7 : Generalized Anxiety Score  ?Nervous, Anxious, on Edge 2  2 2   ?Control/stop worrying 2 2 1 2   ?Worry too much - different things 2 2 1 2   ?Trouble relaxing 2 1 2 2   ?Restless 1 2 2 2   ?Easily annoyed or irritable 1 2 2 2   ?Afraid - awful might happen 2 1 1 2   ?Total GAD 7 Score 12  11  14   ?Anxiety Difficulty Somewhat difficult Somewhat difficult  Somewhat difficult  ? ?  ? Upstream - 11/04/21 1355   ? ?  ? Pregnancy Intention Screening  ? Does the patient want to become pregnant in the next year? No   ? Does the patient's partner want to become pregnant in the next year? No   ? Would the patient like to discuss contraceptive options today? No   ?  ? Contraception Wrap Up  ? Current Method Female Sterilization   ? End Method Female Sterilization   ? ?  ?  ? ?  ?  ?Assessment:  ?   ?1. Anxiety and depression ?Will continue Zoloft 50 mg take 1 daily, try not to skip ?Meds ordered this encounter  ?Medications  ? sertraline (ZOLOFT) 50 MG tablet  ?  Sig: Take 1 tablet (50 mg total) by mouth daily.  ?  Dispense:  90 tablet  ?  Refill:  1  ?  Order Specific Question:   Supervising Provider  ?  Answer:   [  [id number]]  ? traZODone (DESYREL) 50 MG tablet  ?  Sig: Take 1 tablet (50 mg total) by mouth at bedtime as needed for sleep.  ?  Dispense:  30 tablet  ?  Refill:  3  ?  Order Specific Question:   Supervising Provider  ?  Answer:   Duane Lope H [[id number]]  ?  ? ?2. Sleep disturbance ?Will try trazodone 50 mg at bedtime prn  ?   ?Plan:  ?   ?Return in 4 months for physical and labs  ?   ?

## 2021-11-11 ENCOUNTER — Ambulatory Visit: Payer: 59 | Admitting: Adult Health

## 2021-12-02 ENCOUNTER — Other Ambulatory Visit: Payer: Self-pay | Admitting: Adult Health

## 2021-12-03 ENCOUNTER — Telehealth: Payer: Self-pay

## 2021-12-03 NOTE — Telephone Encounter (Signed)
PA for Linzess completed and sent, awaiting decision.

## 2022-02-12 ENCOUNTER — Ambulatory Visit
Admission: EM | Admit: 2022-02-12 | Discharge: 2022-02-12 | Disposition: A | Discharge status: Home / Self Care | Payer: 59 | Attending: Family Medicine | Admitting: Family Medicine

## 2022-02-12 ENCOUNTER — Other Ambulatory Visit: Payer: Self-pay

## 2022-02-12 ENCOUNTER — Encounter: Payer: Self-pay | Admitting: Emergency Medicine

## 2022-02-12 DIAGNOSIS — R1013 Epigastric pain: Secondary | ICD-10-CM | POA: Diagnosis not present

## 2022-02-12 DIAGNOSIS — M546 Pain in thoracic spine: Secondary | ICD-10-CM | POA: Diagnosis not present

## 2022-02-12 LAB — POCT URINALYSIS DIP (MANUAL ENTRY)
Bilirubin, UA: NEGATIVE
Glucose, UA: NEGATIVE mg/dL
Ketones, POC UA: NEGATIVE mg/dL
Leukocytes, UA: NEGATIVE
Nitrite, UA: NEGATIVE
Protein Ur, POC: NEGATIVE mg/dL
Spec Grav, UA: 1.025 (ref 1.010–1.025)
Urobilinogen, UA: 0.2 E.U./dL
pH, UA: 6 (ref 5.0–8.0)

## 2022-02-12 MED ORDER — SUCRALFATE 1 G PO TABS: 1.0000 g | ORAL_TABLET | Freq: Three times a day (TID) | ORAL | 0 refills | Status: DC | PRN

## 2022-02-12 MED ORDER — CYCLOBENZAPRINE HCL 10 MG PO TABS: 10.0000 mg | ORAL_TABLET | Freq: Three times a day (TID) | ORAL | 0 refills | Status: DC | PRN

## 2022-02-12 NOTE — ED Triage Notes (Addendum)
Pt reports nausea and constant epigastric pain that radiates to back for last several days. 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    Pt reports laying flat helps some with pain.denies any change in pain with movement or eating.

## 2022-02-12 NOTE — ED Provider Notes (Signed)
[facility]    CSN: [id number] Arrival date & time: 02/12/22  1820      History   Chief Complaint Chief Complaint  Patient presents with   Nausea    HPI [name] is a 46 y.o. female.   Presenting today with several day history of throbbing epigastric pain that seems to radiate to her back, back soreness worse with movement.  Was nauseated the first day of symptoms but that has eased off.  Denies fever, chills, diarrhea, constipation, body aches.  Has been caring for a family member who is bedbound, having to lift and move them often to is wondering if she pulled something.  So far not trying anything over-the-counter for symptoms but laying flat and still helps with the pain.  Tolerating p.o. well.    Past Medical History:  Diagnosis Date   Anxiety    Chronic constipation    Complication of anesthesia    PONV   Degenerative disc disease    L1 and L5 per pt, no meds   Depression    Right patella fracture    fell 08-31-2019   Wears glasses     Patient Active Problem List   Diagnosis Date Noted   Sleep disturbance 11/04/2021   Dyspareunia, female 02/25/2021   Menorrhagia with irregular cycle 02/25/2021   Encounter for screening fecal occult blood testing 02/25/2021   Encounter for gynecological examination with Papanicolaou smear of cervix 02/25/2021   Anxiety and depression 02/06/2021   Feeling angry 02/06/2021   Screening for colorectal cancer 02/03/2019   Encounter for well woman exam with routine gynecological exam 02/03/2019   Depression 11/22/2018   Anxiety 11/22/2018   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 11/14/2011   Sterilization 11/13/2011    Past Surgical History:  Procedure Laterality Date   CESAREAN SECTION     x 3   CHOLECYSTECTOMY     DILATION AND CURETTAGE OF UTERUS     ORIF PATELLA Right 09/06/2019   Procedure: OPEN REDUCTION INTERNAL (ORIF) FIXATION PATELLA;  Surgeon: Sheral Apley, MD;  Location: [facility];  Service: Orthopedics;  Laterality: Right;   TUBAL LIGATION     WISDOM TOOTH EXTRACTION      OB History     Gravida  6   Para  4   Term  4   Preterm  0   AB  2   Living  4      SAB  0   IAB  2   Ectopic  0   Multiple  0   Live Births  1            Home Medications    Prior to Admission medications   Medication Sig Start Date End Date Taking? Authorizing Provider  cyclobenzaprine (FLEXERIL) 10 MG tablet Take 1 tablet (10 mg total) by mouth 3 (three) times daily as needed for muscle spasms. Do not drink alcohol or drive while taking this medication.  May cause drowsiness. 02/12/22  Yes Particia Nearing, PA-C  sucralfate (CARAFATE) 1 g tablet Take 1 tablet (1 g total) by mouth 3 (three) times daily as needed. May dissolve 1 tablet into a glass of water and drink as needed for stomach irritation 02/12/22  Yes Particia Nearing, PA-C  hydrOXYzine (ATARAX/VISTARIL) 25 MG tablet TAKE (1) TABLET BY MOUTH AT BEDTIME AS NEEDED, and every 8 hours prn anxiety 02/06/21   Adline Potter, NP  LINZESS 145 MCG CAPS capsule  TAKE (1) CAPSULE BY MOUTH EVERY DAY. 12/02/21   Cyril Mourning A, NP  mometasone (ELOCON) 0.1 % ointment Apply topically daily. 10/11/21   Adline Potter, NP  naproxen (NAPROSYN) 500 MG tablet Take 1 tablet (500 mg total) by mouth 2 (two) times daily with a meal. 10/31/21   Wallis Bamberg, PA-C  sertraline (ZOLOFT) 50 MG tablet Take 1 tablet (50 mg total) by mouth daily. 11/04/21   Adline Potter, NP  tiZANidine (ZANAFLEX) 4 MG tablet Take 1 tablet (4 mg total) by mouth at bedtime. 10/31/21   Wallis Bamberg, PA-C  traZODone (DESYREL) 50 MG tablet Take 1 tablet (50 mg total) by mouth at bedtime as needed for sleep. 11/04/21   Adline Potter, NP    Family History Family History  Problem Relation Age of Onset   Cancer Maternal Grandmother    Diabetes Maternal Grandmother    Anesthesia problems Neg Hx     Social History Social  History   Tobacco Use   Smoking status: Never   Smokeless tobacco: Never  Vaping Use   Vaping Use: Never used  Substance Use Topics   Alcohol use: No   Drug use: Never     Allergies   Patient has no known allergies.   Review of Systems Review of Systems Per HPI  Physical Exam Triage Vital Signs ED Triage Vitals  Enc Vitals Group     BP 02/12/22 1830 135/86     Pulse Rate 02/12/22 1830 84     Resp 02/12/22 1830 16     Temp 02/12/22 1830 98 F (36.7 C)     Temp Source 02/12/22 1830 Oral     SpO2 02/12/22 1830 96 %     Weight --      Height --      Head Circumference --      Peak Flow --      Pain Score 02/12/22 1840 8     Pain Loc --      Pain Edu? --      Excl. in GC? --    No data found.  Updated Vital Signs BP 135/86 (BP Location: Right Arm)   Pulse 84   Temp 98 F (36.7 C) (Oral)   Resp 16   LMP 02/12/2022 (Approximate)   SpO2 96%   Visual Acuity Right Eye Distance:   Left Eye Distance:   Bilateral Distance:    Right Eye Near:   Left Eye Near:    Bilateral Near:     Physical Exam Vitals and nursing note reviewed.  Constitutional:      Appearance: Normal appearance. She is not ill-appearing.  HENT:     Head: Atraumatic.     Mouth/Throat:     Mouth: Mucous membranes are moist.  Eyes:     Extraocular Movements: Extraocular movements intact.     Conjunctiva/sclera: Conjunctivae normal.  Cardiovascular:     Rate and Rhythm: Normal rate and regular rhythm.     Heart sounds: Normal heart sounds.  Pulmonary:     Effort: Pulmonary effort is normal.     Breath sounds: Normal breath sounds.  Abdominal:     General: Bowel sounds are normal. There is no distension.     Palpations: Abdomen is soft.     Tenderness: There is abdominal tenderness. There is no right CVA tenderness, left CVA tenderness or guarding.     Comments: Epigastric tenderness to palpation without distention or guarding  Musculoskeletal:  General: Tenderness present.  Normal range of motion.     Cervical back: Normal range of motion and neck supple.     Comments: Bilateral thoracic and lumbar paraspinal muscles tender to palpation.  No midline spinal tenderness to palpation.  Normal gait, range of motion.  Negative straight leg raise bilaterally.  Skin:    General: Skin is warm and dry.  Neurological:     Mental Status: She is alert and oriented to person, place, and time.  Psychiatric:        Mood and Affect: Mood normal.        Thought Content: Thought content normal.        Judgment: Judgment normal.    UC Treatments / Results  Labs (all labs ordered are listed, but only abnormal results are displayed) Labs Reviewed  POCT URINALYSIS DIP (MANUAL ENTRY) - Abnormal; Notable for the following components:      Result Value   Blood, UA moderate (*)    All other components within normal limits  CBC WITH DIFFERENTIAL/PLATELET  COMPREHENSIVE METABOLIC PANEL  LIPASE    EKG   Radiology No results found.  Procedures Procedures (including critical care time)  Medications Ordered in UC Medications - No data to display  Initial Impression / Assessment and Plan / UC Course  I have reviewed the triage vital signs and the nursing notes.  Pertinent labs & imaging results that were available during my care of the patient were reviewed by me and considered in my medical decision making (see chart for details).     Unclear etiology, her back pain is more consistent with a muscular etiology but her epigastric pain could possibly represent some gastritis.  Labs pending, treat with Flexeril, Carafate, bland foods, fluids.  Return for any worsening symptoms.  Final Clinical Impressions(s) / UC Diagnoses   Final diagnoses:  Abdominal pain, epigastric  Acute bilateral thoracic back pain   Discharge Instructions   None    ED Prescriptions     Medication Sig Dispense Auth. Provider   cyclobenzaprine (FLEXERIL) 10 MG tablet Take 1 tablet (10 mg  total) by mouth 3 (three) times daily as needed for muscle spasms. Do not drink alcohol or drive while taking this medication.  May cause drowsiness. 15 tablet Particia Nearing, [place]   sucralfate (CARAFATE) 1 g tablet Take 1 tablet (1 g total) by mouth 3 (three) times daily as needed. May dissolve 1 tablet into a glass of water and drink as needed for stomach irritation 60 tablet Particia Nearing, [place]      PDMP not reviewed this encounter.   Particia Nearing, [place] 02/12/22 2004

## 2022-02-13 LAB — CBC WITH DIFFERENTIAL/PLATELET
Basophils Absolute: 0.1 10*3/uL (ref 0.0–0.2)
Basos: 1 %
EOS (ABSOLUTE): 0.1 10*3/uL (ref 0.0–0.4)
Eos: 1 %
Hematocrit: 41 % (ref 34.0–46.6)
Hemoglobin: 13.5 g/dL (ref 11.1–15.9)
Immature Grans (Abs): 0 10*3/uL (ref 0.0–0.1)
Immature Granulocytes: 0 %
Lymphocytes Absolute: 2.5 10*3/uL (ref 0.7–3.1)
Lymphs: 33 %
MCH: 27.4 pg (ref 26.6–33.0)
MCHC: 32.9 g/dL (ref 31.5–35.7)
MCV: 83 fL (ref 79–97)
Monocytes Absolute: 0.9 10*3/uL (ref 0.1–0.9)
Monocytes: 11 %
Neutrophils Absolute: 4.1 10*3/uL (ref 1.4–7.0)
Neutrophils: 54 %
Platelets: 342 10*3/uL (ref 150–450)
RBC: 4.92 x10E6/uL (ref 3.77–5.28)
RDW: 13.4 % (ref 11.7–15.4)
WBC: 7.7 10*3/uL (ref 3.4–10.8)

## 2022-02-13 LAB — COMPREHENSIVE METABOLIC PANEL
ALT: 33 IU/L — ABNORMAL HIGH (ref 0–32)
AST: 22 IU/L (ref 0–40)
Albumin/Globulin Ratio: 1.3 (ref 1.2–2.2)
Albumin: 4.1 g/dL (ref 3.9–4.9)
Alkaline Phosphatase: 98 IU/L (ref 44–121)
BUN/Creatinine Ratio: 14 (ref 9–23)
BUN: 13 mg/dL (ref 6–24)
Bilirubin Total: 0.2 mg/dL (ref 0.0–1.2)
CO2: 16 mmol/L — ABNORMAL LOW (ref 20–29)
Calcium: 8.7 mg/dL (ref 8.7–10.2)
Chloride: 104 mmol/L (ref 96–106)
Creatinine, Ser: 0.91 mg/dL (ref 0.57–1.00)
Globulin, Total: 3.2 g/dL (ref 1.5–4.5)
Glucose: 93 mg/dL (ref 70–99)
Potassium: 4.3 mmol/L (ref 3.5–5.2)
Sodium: 140 mmol/L (ref 134–144)
Total Protein: 7.3 g/dL (ref 6.0–8.5)
eGFR: 78 mL/min/{1.73_m2} (ref >59)

## 2022-02-13 LAB — LIPASE: Lipase: 30 U/L (ref 14–72)

## 2022-02-14 ENCOUNTER — Emergency Department (HOSPITAL_COMMUNITY): Payer: 59

## 2022-02-14 ENCOUNTER — Other Ambulatory Visit: Payer: Self-pay

## 2022-02-14 ENCOUNTER — Emergency Department (HOSPITAL_COMMUNITY)
Admission: EM | Admit: 2022-02-14 | Discharge: 2022-02-14 | Disposition: A | Discharge status: Home / Self Care | Payer: 59 | Attending: Emergency Medicine | Admitting: Emergency Medicine

## 2022-02-14 ENCOUNTER — Encounter (HOSPITAL_COMMUNITY): Payer: Self-pay | Admitting: Emergency Medicine

## 2022-02-14 DIAGNOSIS — R1013 Epigastric pain: Secondary | ICD-10-CM | POA: Diagnosis not present

## 2022-02-14 LAB — URINALYSIS, ROUTINE W REFLEX MICROSCOPIC
Bacteria, UA: NONE SEEN
Bilirubin Urine: NEGATIVE
Glucose, UA: NEGATIVE mg/dL
Ketones, ur: NEGATIVE mg/dL
Leukocytes,Ua: NEGATIVE
Nitrite: NEGATIVE
Protein, ur: NEGATIVE mg/dL
Specific Gravity, Urine: 1.027 (ref 1.005–1.030)
pH: 5 (ref 5.0–8.0)

## 2022-02-14 LAB — COMPREHENSIVE METABOLIC PANEL
ALT: 30 U/L (ref 0–44)
AST: 21 U/L (ref 15–41)
Albumin: 3.5 g/dL (ref 3.5–5.0)
Alkaline Phosphatase: 77 U/L (ref 38–126)
Anion gap: 7 (ref 5–15)
BUN: 13 mg/dL (ref 6–20)
CO2: 25 mmol/L (ref 22–32)
Calcium: 8.5 mg/dL — ABNORMAL LOW (ref 8.9–10.3)
Chloride: 106 mmol/L (ref 98–111)
Creatinine, Ser: 0.9 mg/dL (ref 0.44–1.00)
GFR, Estimated: >60 mL/min (ref >60)
Glucose, Bld: 102 mg/dL — ABNORMAL HIGH (ref 70–99)
Potassium: 3.7 mmol/L (ref 3.5–5.1)
Sodium: 138 mmol/L (ref 135–145)
Total Bilirubin: 0.3 mg/dL (ref 0.3–1.2)
Total Protein: 7.2 g/dL (ref 6.5–8.1)

## 2022-02-14 LAB — LIPASE, BLOOD: Lipase: 31 U/L (ref 11–51)

## 2022-02-14 LAB — CBC
HCT: 41.6 % (ref 36.0–46.0)
Hemoglobin: 13.6 g/dL (ref 12.0–15.0)
MCH: 28 pg (ref 26.0–34.0)
MCHC: 32.7 g/dL (ref 30.0–36.0)
MCV: 85.6 fL (ref 80.0–100.0)
Platelets: 309 10*3/uL (ref 150–400)
RBC: 4.86 MIL/uL (ref 3.87–5.11)
RDW: 13.7 % (ref 11.5–15.5)
WBC: 6.6 10*3/uL (ref 4.0–10.5)
nRBC: 0 % (ref 0.0–0.2)

## 2022-02-14 MED ORDER — SODIUM CHLORIDE 0.9 % IV BOLUS
1000.0000 mL | Freq: Once | INTRAVENOUS | Status: AC
  Administered 2022-02-14: 1000 mL via INTRAVENOUS

## 2022-02-14 MED ORDER — IOHEXOL 300 MG/ML  SOLN
100.0000 mL | Freq: Once | INTRAMUSCULAR | Status: AC | PRN
  Administered 2022-02-14: 100 mL via INTRAVENOUS

## 2022-02-14 MED ORDER — OMEPRAZOLE 40 MG PO CPDR: 40.0000 mg | DELAYED_RELEASE_CAPSULE | Freq: Every day | ORAL | 0 refills | Status: DC

## 2022-02-14 NOTE — ED Provider Notes (Signed)
[facility] EMERGENCY DEPARTMENT Provider Note   CSN: [id number] Arrival date & time: 02/14/22  1702     History  Chief Complaint  Patient presents with   Abdominal Pain    [name] is a 46 y.o. female.  46 year old female with complaint of a pulsatile/bounding epigastric abdominal pain onset 5 days ago. Pain is constant, worse when she first wakes up in the morning and when she lies down at night, keeps her up at night. Pain is not any better or worse with eating, denies NSAID use or history of ulcers. Has felt nauseous at times without vomiting, denies changes in bowel or bladder habits or blood in her stools, fevers, night sweats, weight loss, CP, SHOB. Patient is a non smoker, non drinking, no history of pancreatitis. Prior abdominal surgery includes cholecystectomy. Patient went to UC for same 2 days ago, given Carafate and muscle relaxant which she has taken without relief. 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        Home Medications Prior to Admission medications   Medication Sig Start Date End Date Taking? Authorizing Provider  cyclobenzaprine (FLEXERIL) 10 MG tablet Take 1 tablet (10 mg total) by mouth 3 (three) times daily as needed for muscle spasms. Do not drink alcohol or drive while taking this medication.  May cause drowsiness. 02/12/22  Yes Particia Nearing, PA-C  hydrOXYzine (ATARAX/VISTARIL) 25 MG tablet TAKE (1) TABLET BY MOUTH AT BEDTIME AS NEEDED, and every 8 hours prn anxiety 02/06/21  Yes Adline Potter, NP  omeprazole (PRILOSEC) 40 MG capsule Take 1 capsule (40 mg total) by mouth daily. 02/14/22 03/16/22 Yes Jeannie Fend, PA-C  sertraline (ZOLOFT) 50 MG tablet Take 1 tablet (50 mg total) by mouth daily. 11/04/21  Yes Cyril Mourning A, NP  sucralfate (CARAFATE) 1 g tablet Take 1 tablet (1 g total) by mouth 3 (three) times daily as needed. May dissolve 1 tablet into a glass of water and drink as needed for stomach irritation 02/12/22   Yes Particia Nearing, PA-C  traZODone (DESYREL) 50 MG tablet Take 1 tablet (50 mg total) by mouth at bedtime as needed for sleep. 11/04/21  Yes Adline Potter, NP      Allergies    Patient has no known allergies.    Review of Systems   Review of Systems Negative except as per HPI Physical Exam Updated Vital Signs BP 110/77   Pulse 78   Temp 97.8 F (36.6 C) (Oral)   Resp 15   Ht 5\' 8"  (1.727 m)   Wt 72.6 kg   LMP 02/12/2022 (Approximate)   SpO2 99%   BMI 24.33 kg/m  Physical Exam Vitals and nursing note reviewed.  Constitutional:      General: She is not in acute distress.    Appearance: She is well-developed. She is not diaphoretic.  HENT:     Head: Normocephalic and atraumatic.  Cardiovascular:     Rate and Rhythm: Normal rate and regular rhythm.     Heart sounds: Normal heart sounds.  Pulmonary:     Effort: Pulmonary effort is normal.  Abdominal:     General: Bowel sounds are normal.     Palpations: Abdomen is soft.     Tenderness: There is abdominal tenderness in the epigastric area. There is no right CVA tenderness or left CVA tenderness.  Musculoskeletal:       Back:  Skin:    General: Skin is  warm and dry.  Neurological:     Mental Status: She is alert and oriented to person, place, and time.  Psychiatric:        Behavior: Behavior normal.     ED Results / Procedures / Treatments   Labs (all labs ordered are listed, but only abnormal results are displayed) Labs Reviewed  COMPREHENSIVE METABOLIC PANEL - Abnormal; Notable for the following components:      Result Value   Glucose, Bld 102 (*)    Calcium 8.5 (*)    All other components within normal limits  URINALYSIS, ROUTINE W REFLEX MICROSCOPIC - Abnormal; Notable for the following components:   Hgb urine dipstick SMALL (*)    All other components within normal limits  LIPASE, BLOOD  CBC    EKG EKG Interpretation  Date/Time:  Friday February 14 2022 17:49:21 EDT Ventricular Rate:   82 PR Interval:  180 QRS Duration: 86 QT Interval:  367 QTC Calculation: 429 R Axis:   74 Text Interpretation: Sinus rhythm No old tracing to compare Confirmed by Eber Hong ([id number]) on 02/14/2022 5:52:34 PM  Radiology CT Abdomen Pelvis W Contrast  Result Date: 02/14/2022 CLINICAL DATA:  Epigastric pain for 5 days, nausea EXAM: CT ABDOMEN AND PELVIS WITH CONTRAST TECHNIQUE: Multidetector CT imaging of the abdomen and pelvis was performed using the standard protocol following bolus administration of intravenous contrast. RADIATION DOSE REDUCTION: This exam was performed according to the departmental dose-optimization program which includes automated exposure control, adjustment of the mA and/or kV according to patient size and/or use of iterative reconstruction technique. CONTRAST:  OMNIPAQUE IOHEXOL 300 MG/ML  SOLN COMPARISON:  None Available. FINDINGS: Lower chest: No acute pleural or parenchymal lung disease. Hepatobiliary: No focal liver abnormality is seen. Status post cholecystectomy. No biliary dilatation. Pancreas: Unremarkable. No pancreatic ductal dilatation or surrounding inflammatory changes. Spleen: Normal in size without focal abnormality. Adrenals/Urinary Tract: Adrenal glands are unremarkable. Kidneys are normal, without renal calculi, focal lesion, or hydronephrosis. Bladder is unremarkable. Stomach/Bowel: No bowel obstruction or ileus. Normal appendix right lower quadrant. No bowel wall thickening or inflammatory change. Vascular/Lymphatic: No significant vascular findings are present. No enlarged abdominal or pelvic lymph nodes. Reproductive: Uterus and bilateral adnexa are unremarkable. Bilateral tubal ligation clips. Other: No free fluid or free intraperitoneal gas. No abdominal wall hernia. Musculoskeletal: No acute or destructive bony lesions. Reconstructed images demonstrate no additional findings. IMPRESSION: 1. No acute intra-abdominal or intrapelvic process. Electronically  Signed   By: Sharlet Salina M.D.   On: 02/14/2022 19:40    Procedures Procedures    Medications Ordered in ED Medications  sodium chloride 0.9 % bolus 1,000 mL (0 mLs Intravenous Stopped 02/14/22 2019)  iohexol (OMNIPAQUE) 300 MG/ML solution 100 mL (100 mLs Intravenous Contrast Given 02/14/22 1922)    ED Course/ Medical Decision Making/ A&P                           Medical Decision Making Amount and/or Complexity of Data Reviewed Labs: ordered. Radiology: ordered.  Risk Prescription drug management.   This patient presents to the ED for concern of epigastric pain, this involves an extensive number of treatment options, and is a complaint that carries with it a high risk of complications and morbidity.  The differential diagnosis includes but not limited to peptic ulcer disease, pancreatitis, dissection, musculoskeletal pain   Co morbidities that complicate the patient evaluation  Degenerative disc disease, anxiety, constipation   Additional history  obtained:  External records from outside source obtained and reviewed including visit to urgent care on 02/12/2022 for same epigastric abdominal pain.  Labs reviewed including CBC, chemistry, lipase and urinalysis, generally reassuring.  Patient was discharged home on Flexeril and Carafate.   Lab Tests:  I Ordered, and personally interpreted labs.  The pertinent results include: CBC unremarkable, CMP unremarkable, lipase within normal limits.  Urinalysis positive for small hemoglobin, similar to prior.   Imaging Studies ordered:  I ordered imaging studies including CT abdomen pelvis with contrast I independently visualized and interpreted imaging which showed no acute abnormality I agree with the radiologist interpretation   Cardiac Monitoring: / EKG:  The patient was maintained on a cardiac monitor.  I personally viewed and interpreted the cardiac monitored which showed an underlying rhythm of: Sinus rhythm, rate  82   Consultations Obtained:  I requested consultation with the ER attending, Dr. Hyacinth Meeker,  and discussed lab and imaging findings as well as pertinent plan - they recommend: CT abdomen pelvis with contrast, if unremarkable, can follow-up outpatient   Problem List / ED Course / Critical interventions / Medication management  46 year old female with complaint of 5 days of throbbing epigastric pain which radiates to her back as above.  Patient's work-up today is largely reassuring including lab work and CT scan abdomen pelvis.  Plan is to start omeprazole and refer to GI with return to ER precautions given. I have reviewed the patients home medicines and have made adjustments as needed   Social Determinants of Health:  Lives with family, has PCP   Test / Admission - Considered:  Consider admission for further work-up and evaluation however patient stable and able to follow-up outpatient.         Final Clinical Impression(s) / ED Diagnoses Final diagnoses:  Epigastric abdominal pain    Rx / DC Orders ED Discharge Orders          Ordered    omeprazole (PRILOSEC) 40 MG capsule  Daily        02/14/22 1958              Alden Hipp 02/14/22 2119    Eber Hong, MD 02/15/22 1122

## 2022-02-14 NOTE — Discharge Instructions (Addendum)
Take omeprazole as prescribed.  Follow-up with GI, call to schedule appointment on Monday.  Return to the emergency room for worsening or concerning symptoms.

## 2022-02-14 NOTE — ED Triage Notes (Signed)
Pt presents with epigastric pain x 5 days with nausea.

## 2022-02-14 NOTE — ED Notes (Signed)
Patient transported to CT 

## 2022-03-10 ENCOUNTER — Other Ambulatory Visit: Payer: Self-pay | Admitting: Adult Health

## 2022-03-31 ENCOUNTER — Other Ambulatory Visit: Payer: Self-pay | Admitting: Adult Health

## 2022-05-15 ENCOUNTER — Ambulatory Visit (INDEPENDENT_AMBULATORY_CARE_PROVIDER_SITE_OTHER): Payer: Self-pay | Admitting: Adult Health

## 2022-05-15 ENCOUNTER — Encounter: Payer: Self-pay | Admitting: Adult Health

## 2022-05-15 VITALS — BP 122/80 | HR 80 | Ht 68.0 in | Wt 193.0 lb

## 2022-05-15 DIAGNOSIS — G479 Sleep disorder, unspecified: Secondary | ICD-10-CM

## 2022-05-15 DIAGNOSIS — F32A Depression, unspecified: Secondary | ICD-10-CM

## 2022-05-15 DIAGNOSIS — Z01419 Encounter for gynecological examination (general) (routine) without abnormal findings: Secondary | ICD-10-CM

## 2022-05-15 DIAGNOSIS — Z1211 Encounter for screening for malignant neoplasm of colon: Secondary | ICD-10-CM

## 2022-05-15 DIAGNOSIS — Z1212 Encounter for screening for malignant neoplasm of rectum: Secondary | ICD-10-CM

## 2022-05-15 DIAGNOSIS — Z1231 Encounter for screening mammogram for malignant neoplasm of breast: Secondary | ICD-10-CM

## 2022-05-15 DIAGNOSIS — F419 Anxiety disorder, unspecified: Secondary | ICD-10-CM

## 2022-05-15 LAB — HEMOCCULT GUIAC POC 1CARD (OFFICE): Fecal Occult Blood, POC: NEGATIVE

## 2022-05-15 MED ORDER — SERTRALINE HCL 50 MG PO TABS: 50.0000 mg | ORAL_TABLET | Freq: Every day | ORAL | 3 refills | Status: DC

## 2022-05-15 MED ORDER — HYDROXYZINE HCL 25 MG PO TABS: ORAL_TABLET | ORAL | 3 refills | Status: DC

## 2022-05-15 MED ORDER — TRAZODONE HCL 100 MG PO TABS: 100.0000 mg | ORAL_TABLET | Freq: Every day | ORAL | 12 refills | Status: DC

## 2022-05-15 NOTE — Progress Notes (Signed)
Patient ID: [name], female   DOB: [birth year], 46 y.o.   MRN: [id number] History of Present Illness: [name] is a 46 year old white female,married, [id number] in for well woman gyn exam. She says she does not sleep well. She is active, walks 3 miles every day.   Last pap was negative HPV and malignancy 02/25/21.    Current Medications, Allergies, Past Medical History, Past Surgical History, Family History and Social History were reviewed in [employer] record.     Review of Systems: Patient denies any headaches, hearing loss, fatigue, blurred vision, shortness of breath, chest pain, abdominal pain, problems with bowel movements,(has 1-2 BMs a week) urination, or intercourse. No joint pain or mood swings.  Does not sleep well, if wakes up, can't go back to sleep    Physical Exam:BP 122/80 (BP Location: Left Arm, Patient Position: Sitting, Cuff Size: Normal)   Pulse 80   Ht 5\' 8"  (1.727 m)   Wt 193 lb (87.5 kg)   LMP 04/27/2022 (Approximate)   BMI 29.35 kg/m   General:  Well developed, well nourished, no acute distress Skin:  Warm and dry Neck:  Midline trachea, normal thyroid, good ROM, no lymphadenopathy Lungs; Clear to auscultation bilaterally Breast:  No dominant palpable mass, retraction, or nipple discharge Cardiovascular: Regular rate and rhythm Abdomen:  Soft, non tender, no hepatosplenomegaly Pelvic:  External genitalia is normal in appearance, no lesions.  The vagina is normal in appearance. Urethra has no lesions or masses. The cervix is bulbous.  Uterus is felt to be normal size, shape, and contour.  No adnexal masses or tenderness noted.Bladder is non tender, no masses felt. Rectal: Good sphincter tone, no polyps, or hemorrhoids felt.  Hemoccult negative. Extremities/musculoskeletal:  No swelling or varicosities noted, no clubbing or cyanosis Psych:  No mood changes, alert and cooperative,seems happy AA is 0 Fall risk is low  Upstream - 05/15/22  1145       Pregnancy Intention Screening   Does the patient want to become pregnant in the next year? No    Does the patient's partner want to become pregnant in the next year? No    Would the patient like to discuss contraceptive options today? No      Contraception Wrap Up   Current Method Female Sterilization    End Method Female Sterilization    Contraception Counseling Provided No                05/15/2022   11:49 AM 11/04/2021    1:55 PM 02/25/2021    2:33 PM  Depression screen PHQ 2/9  Decreased Interest 1 2 1   Down, Depressed, Hopeless 0 1   PHQ - 2 Score 1 3 1   Altered sleeping 3 3 3   Tired, decreased energy 2 2 1   Change in appetite 0 0 0  Feeling bad or failure about yourself  0 1 2  Trouble concentrating 2 2 2   Moving slowly or fidgety/restless 0 1 0  Suicidal thoughts 0 0 0  PHQ-9 Score 8 12 9   Difficult doing work/chores  Somewhat difficult        05/15/2022   11:49 AM 11/04/2021    1:58 PM 11/04/2021    1:57 PM 02/25/2021    2:33 PM  GAD 7 : Generalized Anxiety Score  Nervous, Anxious, on Edge 2 2  2   Control/stop worrying 1 2 2 1   Worry too much - different things 1 2 2 1   Trouble relaxing  2 2 1 2   Restless 1 1 2 2   Easily annoyed or irritable 1 1 2 2   Afraid - awful might happen 0 2 1 1   Total GAD 7 Score 8 12  11   Anxiety Difficulty  Somewhat difficult Somewhat difficult     Upstream - 05/15/22 1145       Pregnancy Intention Screening   Does the patient want to become pregnant in the next year? No    Does the patient's partner want to become pregnant in the next year? No    Would the patient like to discuss contraceptive options today? No      Contraception Wrap Up   Current Method Female Sterilization    End Method Female Sterilization    Contraception Counseling Provided No              Examination chaperoned by Levy Pupa LPN  Impression and Plan: 1. Screening mammogram for breast cancer Order in, pt to call for mammogram    2. Encounter for well woman exam with routine gynecological exam Physical in 1 year Pap in 2025 Stay active  3. Anxiety and depression She says she is good on Zoloft, will continue a 50 mg 1 daily  Will continue Vistaril as needed for anxiety flare  4. Sleep disturbance She says if she wakes up she is just up can.t go back to sleep Will increase trazodone to 100 mg at hs Meds ordered this encounter  Medications   traZODone (DESYREL) 100 MG tablet    Sig: Take 1 tablet (100 mg total) by mouth at bedtime.    Dispense:  30 tablet    Refill:  12    Order Specific Question:   Supervising Provider    Answer:   Tania Ade H [[id number]]   sertraline (ZOLOFT) 50 MG tablet    Sig: Take 1 tablet (50 mg total) by mouth daily.    Dispense:  90 tablet    Refill:  3    Order Specific Question:   Supervising Provider    Answer:   Elonda Husky, LUTHER H [[id number]]   hydrOXYzine (ATARAX) 25 MG tablet    Sig: TAKE (1) TABLET BY MOUTH AT BEDTIME EVERY EIGHT HOURS AS NEEDED FOR ANXIETY.    Dispense:  90 tablet    Refill:  3    Order Specific Question:   Supervising Provider    Answer:   Elonda Husky, LUTHER H [[id number]]     5. Encounter for screening fecal occult blood testing Hemoccult is negative   6. Screening for colorectal cancer Refer to [facility] for colonoscopy

## 2022-05-21 ENCOUNTER — Encounter: Payer: Self-pay | Admitting: *Deleted

## 2022-05-28 ENCOUNTER — Ambulatory Visit (HOSPITAL_COMMUNITY)
Admission: RE | Admit: 2022-05-28 | Discharge: 2022-05-28 | Disposition: A | Discharge status: Home / Self Care | Payer: 59 | Source: Ambulatory Visit | Attending: Adult Health | Admitting: Adult Health

## 2022-05-28 DIAGNOSIS — Z1231 Encounter for screening mammogram for malignant neoplasm of breast: Secondary | ICD-10-CM | POA: Diagnosis not present

## 2022-06-02 ENCOUNTER — Other Ambulatory Visit (HOSPITAL_COMMUNITY): Payer: Self-pay | Admitting: Adult Health

## 2022-06-02 ENCOUNTER — Telehealth: Payer: Self-pay | Admitting: Adult Health

## 2022-06-02 DIAGNOSIS — R928 Other abnormal and inconclusive findings on diagnostic imaging of breast: Secondary | ICD-10-CM

## 2022-06-02 NOTE — Telephone Encounter (Signed)
Patient is calling about a test result. Please advise.

## 2022-06-02 NOTE — Telephone Encounter (Signed)
Pt has possible asymmetry right breast and dense breast on mammogram and worried,  and has follow up 06/17/22, told not to worry but keep appt in follow up.

## 2022-06-03 ENCOUNTER — Encounter: Payer: Self-pay | Admitting: *Deleted

## 2022-06-03 NOTE — Patient Instructions (Signed)
  Procedure: colonoscopy  Estimated body mass index is 29.35 kg/m as calculated from the following:   Height as of this encounter: 5\' 8"  (1.727 m).   Weight as of this encounter: 193 lb (87.5 kg).   Have you had a colonoscopy before?  No  Do you have family history of colon cancer?  No  Do you have a family history of polyps? No  Previous colonoscopy with polyps removed? No  Do you have a history colorectal cancer?   No  Are you diabetic?  No  Do you have a prosthetic or mechanical heart valve? No  Do you have a pacemaker/defibrillator?   No  Have you had endocarditis/atrial fibrillation?  No  Do you use supplemental oxygen/CPAP?  No  Have you had joint replacement within the last 12 months?  No  Do you tend to be constipated or have to use laxatives?  No   Do you have history of alcohol use? If yes, how much and how often.  No  Do you have history or are you using drugs? If yes, what do are you  using?  No  Have you ever had a stroke/heart attack?  No  Have you ever had a heart or other vascular stent placed,?No  Do you take weight loss medication? No  female patients,: have you had a hysterectomy? No                              are you post menopausal?  No                              do you still have your menstrual cycle? yes    Date of last menstrual period? November  Do you take any blood-thinning medications such as: (Plavix, aspirin, Coumadin, Aggrenox, Brilinta, Xarelto, Eliquis, Pradaxa, Savaysa or Effient)? No  If yes we need the name, milligram, dosage and who is prescribing doctor:  N/A             Current Outpatient Medications  Medication Sig Dispense Refill   hydrOXYzine (ATARAX) 25 MG tablet TAKE (1) TABLET BY MOUTH AT BEDTIME EVERY EIGHT HOURS AS NEEDED FOR ANXIETY. 90 tablet 3   sertraline (ZOLOFT) 50 MG tablet Take 1 tablet (50 mg total) by mouth daily. 90 tablet 3   traZODone (DESYREL) 100 MG tablet Take 1 tablet (100 mg total) by mouth at  bedtime. 30 tablet 12   No current facility-administered medications for this visit.    No Known Allergies

## 2022-06-11 ENCOUNTER — Ambulatory Visit (HOSPITAL_COMMUNITY)
Admission: RE | Admit: 2022-06-11 | Discharge: 2022-06-11 | Disposition: A | Discharge status: Home / Self Care | Payer: 59 | Source: Ambulatory Visit | Attending: Adult Health | Admitting: Adult Health

## 2022-06-11 ENCOUNTER — Other Ambulatory Visit (HOSPITAL_COMMUNITY): Payer: Self-pay | Admitting: Adult Health

## 2022-06-11 DIAGNOSIS — R928 Other abnormal and inconclusive findings on diagnostic imaging of breast: Secondary | ICD-10-CM | POA: Diagnosis not present

## 2022-06-12 ENCOUNTER — Telehealth: Payer: Self-pay | Admitting: Adult Health

## 2022-06-12 NOTE — Telephone Encounter (Signed)
Left message I called and call me back

## 2022-06-12 NOTE — Telephone Encounter (Signed)
Has biopsy 06/17/22 tried to answer her questions

## 2022-06-17 ENCOUNTER — Ambulatory Visit (HOSPITAL_COMMUNITY)
Admission: RE | Admit: 2022-06-17 | Discharge: 2022-06-17 | Disposition: A | Discharge status: Home / Self Care | Payer: 59 | Source: Ambulatory Visit | Attending: Adult Health | Admitting: Adult Health

## 2022-06-17 ENCOUNTER — Encounter (HOSPITAL_COMMUNITY): Payer: Self-pay

## 2022-06-17 ENCOUNTER — Ambulatory Visit (HOSPITAL_COMMUNITY): Payer: Self-pay

## 2022-06-17 ENCOUNTER — Other Ambulatory Visit (HOSPITAL_COMMUNITY): Payer: Self-pay | Admitting: Adult Health

## 2022-06-17 DIAGNOSIS — R928 Other abnormal and inconclusive findings on diagnostic imaging of breast: Secondary | ICD-10-CM | POA: Insufficient documentation

## 2022-06-17 HISTORY — PX: BREAST BIOPSY: SHX20

## 2022-06-17 MED ORDER — LIDOCAINE-EPINEPHRINE (PF) 1 %-1:200000 IJ SOLN
INTRAMUSCULAR | Status: AC
  Filled 2022-06-17: qty 30

## 2022-06-17 MED ORDER — LIDOCAINE HCL (PF) 2 % IJ SOLN
INTRAMUSCULAR | Status: DC
Start: 2022-06-17 — End: 2022-06-17
  Filled 2022-06-17: qty 10

## 2022-06-18 ENCOUNTER — Telehealth: Payer: Self-pay | Admitting: Family Medicine

## 2022-06-18 ENCOUNTER — Ambulatory Visit (INDEPENDENT_AMBULATORY_CARE_PROVIDER_SITE_OTHER): Payer: Self-pay | Admitting: Family Medicine

## 2022-06-18 DIAGNOSIS — F439 Reaction to severe stress, unspecified: Secondary | ICD-10-CM

## 2022-06-18 LAB — SURGICAL PATHOLOGY

## 2022-06-18 NOTE — Telephone Encounter (Signed)
Error

## 2022-06-18 NOTE — Telephone Encounter (Signed)
Pt had biopsy done and pt states nerves have been tore up. Pt husband was here earlier Seth Bake "Loraine Leriche"). Pt states Dr.Scott said he would take her back as a patient. Pt is going to schedule office visit for January. Please advise. Thank you.   [place]

## 2022-06-18 NOTE — Progress Notes (Signed)
Phone visit at the request of the patient Patient has been very upset ever since having a biopsy done she is very fearful of having cancer She has not been sleeping or eating well Husband and wife requested that I talk with them regarding her situation Thankfully her biopsy came back as being benign.  They will be doing a follow-up ultrasound and mammogram in 6 months She will continue to follow with Cyril Mourning NP for her female health checkups and associated issues.  The patient has requested a follow-up here for general medical health issues.  We already provide general medical care for her husband as well as children so we will be able to do this.  She states that she will set up a general medical follow-up for January Virtual Visit via Video Note  I connected with Zenda Alpers on 06/18/22 at  4:30 PM EST by a video enabled telemedicine application and verified that I am speaking with the correct person using two identifiers.  Location: Patient: Home Provider: Office   I discussed the limitations of evaluation and management by telemedicine and the availability of in person appointments. The patient expressed understanding and agreed to proceed.  History of Present Illness:    Observations/Objective:   Assessment and Plan:   Follow Up Instructions:    I discussed the assessment and treatment plan with the patient. The patient was provided an opportunity to ask questions and all were answered. The patient agreed with the plan and demonstrated an understanding of the instructions.   The patient was advised to call back or seek an in-person evaluation if the symptoms worsen or if the condition fails to improve as anticipated.  I provided 8 minutes of non-face-to-face time during this encounter.   Lilyan Punt, MD

## 2022-06-18 NOTE — Telephone Encounter (Signed)
Front- please tell pt I would be perfectly fine with helping her. I need her to come by offic ethis afternoon and sign on as my patient so I can legally open her chart and I can call her at the end of the day to talk with her.

## 2022-06-23 NOTE — Progress Notes (Signed)
Appropriate. ASA 2. Needs pregnancy screen prior.

## 2022-06-24 ENCOUNTER — Encounter: Payer: Self-pay | Admitting: Family Medicine

## 2022-06-26 ENCOUNTER — Encounter: Payer: Self-pay | Admitting: *Deleted

## 2022-06-26 DIAGNOSIS — Z1211 Encounter for screening for malignant neoplasm of colon: Secondary | ICD-10-CM

## 2022-06-26 MED ORDER — PEG 3350-KCL-NA BICARB-NACL 420 G PO SOLR: 4000.0000 mL | Freq: Once | ORAL | 0 refills | Status: AC

## 2022-06-26 NOTE — Progress Notes (Signed)
Pt has been scheduled for 08/01/22 at 9:30 am with Dr.Carver. Instructions mailed and prep sent to the pharmacy

## 2022-07-17 ENCOUNTER — Other Ambulatory Visit (HOSPITAL_COMMUNITY): Payer: Self-pay | Admitting: Adult Health

## 2022-07-17 ENCOUNTER — Other Ambulatory Visit: Payer: Self-pay | Admitting: Adult Health

## 2022-07-17 ENCOUNTER — Telehealth: Payer: Self-pay | Admitting: Family Medicine

## 2022-07-17 DIAGNOSIS — R928 Other abnormal and inconclusive findings on diagnostic imaging of breast: Secondary | ICD-10-CM

## 2022-07-17 DIAGNOSIS — Z13220 Encounter for screening for lipoid disorders: Secondary | ICD-10-CM

## 2022-07-17 DIAGNOSIS — N6310 Unspecified lump in the right breast, unspecified quadrant: Secondary | ICD-10-CM

## 2022-07-17 DIAGNOSIS — Z1321 Encounter for screening for nutritional disorder: Secondary | ICD-10-CM

## 2022-07-17 DIAGNOSIS — Z79899 Other long term (current) drug therapy: Secondary | ICD-10-CM

## 2022-07-17 MED ORDER — LINACLOTIDE 145 MCG PO CAPS: 145.0000 ug | ORAL_CAPSULE | Freq: Every day | ORAL | 6 refills | Status: DC

## 2022-07-17 NOTE — Telephone Encounter (Signed)
Patient has not had any recent labs at this office. Please advise.

## 2022-07-17 NOTE — Progress Notes (Signed)
Pt requests refill on linzess,it works

## 2022-07-17 NOTE — Telephone Encounter (Signed)
We recommend being targeted with the labs-lipid, metabolic 7, liver, vitamin D Diagnosis-wellness, hyperlipidemia, elevated creatinine Patient should try to do these fasting but she should be well-hydrated in other words drink 1 to 2 glasses of water at least 60 minutes before having blood drawn

## 2022-07-17 NOTE — Telephone Encounter (Signed)
Patient has physical in February and needing labs done 

## 2022-07-18 NOTE — Telephone Encounter (Signed)
Blood work ordered in [employer]. Patient notified and advised to have labs drawn when she is fasting but well hydrated.

## 2022-07-23 ENCOUNTER — Encounter: Payer: Self-pay | Admitting: *Deleted

## 2022-07-24 ENCOUNTER — Telehealth: Payer: Self-pay | Admitting: *Deleted

## 2022-07-24 NOTE — Telephone Encounter (Signed)
Pt has [employer], [phone]. Her insurance says no PA is needed for Linzess 145 mcg. I called [employer] and let them know no that no PA is needed and they should be able to process claim with no problem. [facility] has pt with [employer], they don't have her updated info. Pt aware to take updated insurance info to [employer]. Pt voiced understanding. Conrad

## 2022-07-28 ENCOUNTER — Other Ambulatory Visit (HOSPITAL_COMMUNITY)
Admission: RE | Admit: 2022-07-28 | Discharge: 2022-07-28 | Disposition: A | Discharge status: Home / Self Care | Payer: 59 | Source: Ambulatory Visit | Attending: Internal Medicine | Admitting: Internal Medicine

## 2022-07-28 DIAGNOSIS — Z1211 Encounter for screening for malignant neoplasm of colon: Secondary | ICD-10-CM

## 2022-08-01 ENCOUNTER — Ambulatory Visit (HOSPITAL_COMMUNITY)
Admission: RE | Admit: 2022-08-01 | Discharge: 2022-08-01 | Disposition: A | Discharge status: Home / Self Care | Payer: 59 | Source: Ambulatory Visit | Attending: Internal Medicine | Admitting: Internal Medicine

## 2022-08-01 ENCOUNTER — Ambulatory Visit (HOSPITAL_COMMUNITY): Payer: 59 | Admitting: Certified Registered Nurse Anesthetist

## 2022-08-01 ENCOUNTER — Other Ambulatory Visit: Payer: Self-pay

## 2022-08-01 ENCOUNTER — Ambulatory Visit (HOSPITAL_BASED_OUTPATIENT_CLINIC_OR_DEPARTMENT_OTHER): Payer: 59 | Admitting: Certified Registered Nurse Anesthetist

## 2022-08-01 ENCOUNTER — Encounter (HOSPITAL_COMMUNITY): Payer: Self-pay

## 2022-08-01 ENCOUNTER — Encounter (HOSPITAL_COMMUNITY)
Admission: RE | Disposition: A | Discharge status: Home / Self Care | Payer: Self-pay | Source: Ambulatory Visit | Attending: Internal Medicine

## 2022-08-01 ENCOUNTER — Other Ambulatory Visit (HOSPITAL_COMMUNITY)
Admission: RE | Admit: 2022-08-01 | Discharge: 2022-08-01 | Disposition: A | Discharge status: Home / Self Care | Payer: 59 | Source: Ambulatory Visit | Attending: Internal Medicine | Admitting: Internal Medicine

## 2022-08-01 DIAGNOSIS — Z1211 Encounter for screening for malignant neoplasm of colon: Secondary | ICD-10-CM | POA: Diagnosis present

## 2022-08-01 DIAGNOSIS — K648 Other hemorrhoids: Secondary | ICD-10-CM | POA: Insufficient documentation

## 2022-08-01 DIAGNOSIS — F32A Depression, unspecified: Secondary | ICD-10-CM | POA: Diagnosis not present

## 2022-08-01 DIAGNOSIS — M1990 Unspecified osteoarthritis, unspecified site: Secondary | ICD-10-CM | POA: Diagnosis not present

## 2022-08-01 DIAGNOSIS — F419 Anxiety disorder, unspecified: Secondary | ICD-10-CM | POA: Insufficient documentation

## 2022-08-01 HISTORY — DX: Other specified postprocedural states: Z98.890

## 2022-08-01 HISTORY — PX: COLONOSCOPY WITH PROPOFOL: SHX5780

## 2022-08-01 HISTORY — DX: Nausea with vomiting, unspecified: R11.2

## 2022-08-01 LAB — PREGNANCY, URINE: Preg Test, Ur: NEGATIVE

## 2022-08-01 SURGERY — COLONOSCOPY WITH PROPOFOL
Anesthesia: General

## 2022-08-01 MED ORDER — LACTATED RINGERS IV SOLN: INTRAVENOUS | Status: DC | PRN

## 2022-08-01 MED ORDER — LIDOCAINE HCL (CARDIAC) PF 100 MG/5ML IV SOSY
PREFILLED_SYRINGE | INTRAVENOUS | Status: DC | PRN
  Administered 2022-08-01: 50 mg via INTRAVENOUS

## 2022-08-01 MED ORDER — LACTATED RINGERS IV SOLN: INTRAVENOUS | Status: DC

## 2022-08-01 MED ORDER — PROPOFOL 10 MG/ML IV BOLUS
INTRAVENOUS | Status: DC | PRN
  Administered 2022-08-01: 100 mg via INTRAVENOUS
  Administered 2022-08-01 (×5): 50 mg via INTRAVENOUS

## 2022-08-01 MED ORDER — STERILE WATER FOR IRRIGATION IR SOLN
Status: DC | PRN
  Administered 2022-08-01 (×2): 60 mL

## 2022-08-01 NOTE — Anesthesia Preprocedure Evaluation (Signed)
Anesthesia Evaluation  Patient identified by MRN, date of birth, ID band Patient awake    Reviewed: Allergy & Precautions, H&P , NPO status , Patient's Chart, lab work & pertinent test results  Airway Mallampati: II  TM Distance: >3 FB Neck ROM: full    Dental  (+) Chipped,    Pulmonary neg pulmonary ROS   Pulmonary exam normal breath sounds clear to auscultation       Cardiovascular negative cardio ROS  Rhythm:regular Rate:Normal     Neuro/Psych  PSYCHIATRIC DISORDERS Anxiety Depression    negative neurological ROS     GI/Hepatic negative GI ROS, Neg liver ROS,,,  Endo/Other  negative endocrine ROS    Renal/GU negative Renal ROS     Musculoskeletal  (+) Arthritis , Osteoarthritis,  DDD   Abdominal   Peds  Hematology negative hematology ROS (+)   Anesthesia Other Findings Right patella fx  Reproductive/Obstetrics S/p BTL                             Anesthesia Physical Anesthesia Plan  ASA: 2  Anesthesia Plan: General   Post-op Pain Management: GA combined w/ Regional for post-op pain   Induction: Intravenous  PONV Risk Score and Plan:   Airway Management Planned: Nasal Cannula  Additional Equipment: None  Intra-op Plan:   Post-operative Plan:   Informed Consent: I have reviewed the patients History and Physical, chart, labs and discussed the procedure including the risks, benefits and alternatives for the proposed anesthesia with the patient or authorized representative who has indicated his/her understanding and acceptance.       Plan Discussed with: CRNA  Anesthesia Plan Comments:         Anesthesia Quick Evaluation

## 2022-08-01 NOTE — Anesthesia Postprocedure Evaluation (Signed)
Anesthesia Post Note  Patient: [name]  Procedure(s) Performed: COLONOSCOPY WITH PROPOFOL  Patient location during evaluation: PACU Anesthesia Type: General Level of consciousness: awake and alert Pain management: pain level controlled Vital Signs Assessment: post-procedure vital signs reviewed and stable Respiratory status: spontaneous breathing, nonlabored ventilation, respiratory function stable and patient connected to nasal cannula oxygen Cardiovascular status: stable and blood pressure returned to baseline Postop Assessment: no apparent nausea or vomiting Anesthetic complications: no   No notable events documented.   Last Vitals:  Vitals:   08/01/22 0852 08/01/22 1001  BP: 132/74 (!) 102/50  Pulse: 84 79  Resp: 18 14  Temp: 36.8 C 36.8 C  SpO2: 98% 97%    Last Pain:  Vitals:   08/01/22 1005  TempSrc:   PainSc: 0-No pain                 Nicanor Alcon

## 2022-08-01 NOTE — Op Note (Signed)
[facility] Patient Name: [name] Procedure Date: 08/01/2022 9:32 AM MRN: [id number] Date of Birth: 12/25/1975 Attending MD: Elon Alas. Abbey Chatters , [place], [id number] CSN: [id number] Age: 47 Admit Type: Outpatient Procedure:                Colonoscopy Indications:              Screening for colorectal malignant neoplasm Providers:                Elon Alas. Abbey Chatters, DO, Crystal Page, Illene Labrador Referring MD:              Medicines:                See the Anesthesia note for documentation of the                            administered medications Complications:            No immediate complications. Estimated Blood Loss:     Estimated blood loss: none. Procedure:                Pre-Anesthesia Assessment:                           - The anesthesia plan was to use monitored                            anesthesia care (MAC).                           After obtaining informed consent, the colonoscope                            was passed under direct vision. Throughout the                            procedure, the patient's blood pressure, pulse, and                            oxygen saturations were monitored continuously. The                            [id number]-[id number] ([id number]) was introduced through the                            anus and advanced to the the cecum, identified by                            appendiceal orifice and ileocecal valve. The                            colonoscopy was performed without difficulty. The                            patient tolerated the procedure well. The quality  of the bowel preparation was evaluated using the                            BBPS [facility] Bowel Preparation Scale) with scores                            of: Right Colon = 3, Transverse Colon = 3 and Left                            Colon = 3 (entire mucosa seen well with no residual                            staining, small fragments  of stool or opaque                            liquid). The total BBPS score equals 9. Scope In: 9:43:42 AM Scope Out: 10:00:12 AM Scope Withdrawal Time: 0 hours 10 minutes 14 seconds  Total Procedure Duration: 0 hours 16 minutes 30 seconds  Findings:      The perianal and digital rectal examinations were normal.      Non-bleeding internal hemorrhoids were found during endoscopy.      The exam was otherwise without abnormality. Impression:               - Non-bleeding internal hemorrhoids.                           - The examination was otherwise normal.                           - No specimens collected. Moderate Sedation:      Per Anesthesia Care Recommendation:           - Patient has a contact number available for                            emergencies. The signs and symptoms of potential                            delayed complications were discussed with the                            patient. Return to normal activities tomorrow.                            Written discharge instructions were provided to the                            patient.                           - Resume previous diet.                           - Continue present medications.                           -  Repeat colonoscopy in 10 years for screening                            purposes.                           - Return to GI clinic PRN. Procedure Code(s):        --- Professional ---                           Y5183, Colorectal cancer screening; colonoscopy on                            individual not meeting criteria for high risk Diagnosis Code(s):        --- Professional ---                           Z12.11, Encounter for screening for malignant                            neoplasm of colon                           K64.8, Other hemorrhoids CPT copyright 2022 American Medical Association. All rights reserved. The codes documented in this report are preliminary and upon coder review may  be revised to  meet current compliance requirements. Elon Alas. Abbey Chatters, DO [place] Abbey Chatters, DO 08/01/2022 10:02:46 AM This report has been signed electronically. Number of Addenda: 0

## 2022-08-01 NOTE — H&P (Signed)
Primary Care Physician:  Kathyrn Drown, MD Primary Gastroenterologist:  Dr. Abbey Chatters  Pre-Procedure History & Physical: HPI:  [name] is a 47 y.o. female is here for first ever colonoscopy for colon cancer screening purposes.  Patient denies any family history of colorectal cancer.  No melena or hematochezia.  No abdominal pain or unintentional weight loss.  No change in bowel habits.  Overall feels well from a GI standpoint.  Past Medical History:  Diagnosis Date   Anxiety    Chronic constipation    Complication of anesthesia    PONV   Degenerative disc disease    L1 and L5 per pt, no meds   Depression    PONV (postoperative nausea and vomiting)    Right patella fracture    fell 08-31-2019   Wears glasses     Past Surgical History:  Procedure Laterality Date   BREAST BIOPSY Right 06/17/2022   [place] RT BREAST BX W LOC DEV 1ST LESION IMG BX SPEC US GUIDE 06/17/2022 Everlean Alstrom, MD AP-ULTRASOUND   CESAREAN SECTION     x 3   CHOLECYSTECTOMY     DILATION AND CURETTAGE OF UTERUS     ORIF PATELLA Right 09/06/2019   Procedure: OPEN REDUCTION INTERNAL (ORIF) FIXATION PATELLA;  Surgeon: Renette Butters, MD;  Location: [facility];  Service: Orthopedics;  Laterality: Right;   TUBAL LIGATION     WISDOM TOOTH EXTRACTION      Prior to Admission medications   Medication Sig Start Date End Date Taking? Authorizing Provider  hydrOXYzine (ATARAX) 25 MG tablet TAKE (1) TABLET BY MOUTH AT BEDTIME EVERY EIGHT HOURS AS NEEDED FOR ANXIETY. Patient taking differently: Take 25 mg by mouth every other day. At bedtime as needed 05/15/22  Yes Estill Dooms, NP  linaclotide [facility]) 145 MCG CAPS capsule Take 1 capsule (145 mcg total) by mouth daily before breakfast. 07/17/22  Yes Estill Dooms, NP  MELATONIN KIDS GUMMIES PO Take 2 tablets by mouth at bedtime.   Yes [provider]  sertraline (ZOLOFT) 50 MG tablet Take 1 tablet (50 mg total) by mouth daily.  05/15/22  Yes Estill Dooms, NP  traZODone (DESYREL) 100 MG tablet Take 1 tablet (100 mg total) by mouth at bedtime. Patient taking differently: Take 100 mg by mouth every other day. Bedtime as needed 05/15/22  Yes Estill Dooms, NP    Allergies as of 06/26/2022   (No Known Allergies)    Family History  Problem Relation Age of Onset   Cancer Maternal Grandmother    Diabetes Maternal Grandmother    Anesthesia problems Neg Hx     Social History   Socioeconomic History   Marital status: Married    Spouse name: Not on file   Number of children: Not on file   Years of education: Not on file   Highest education level: Not on file  Occupational History   Not on file  Tobacco Use   Smoking status: Never   Smokeless tobacco: Never  Vaping Use   Vaping Use: Never used  Substance and Sexual Activity   Alcohol use: No   Drug use: Never   Sexual activity: Yes    Partners: Male    Birth control/protection: Surgical    Comment: tubal  Other Topics Concern   Not on file  Social History Narrative   Not on file   Social Determinants of Health   Financial Resource Strain: Low Risk  (05/15/2022)  Overall Financial Resource Strain (CARDIA)    Difficulty of Paying Living Expenses: Not hard at all  Food Insecurity: No Food Insecurity (05/15/2022)   Hunger Vital Sign    Worried About Running Out of Food in the Last Year: Never true    Ran Out of Food in the Last Year: Never true  Transportation Needs: No Transportation Needs (05/15/2022)   PRAPARE - Hydrologist (Medical): No    Lack of Transportation (Non-Medical): No  Physical Activity: Sufficiently Active (05/15/2022)   Exercise Vital Sign    Days of Exercise per Week: 5 days    Minutes of Exercise per Session: 40 min  Stress: Stress Concern Present (05/15/2022)   [place]    Feeling of Stress : Rather much  Social  Connections: Moderately Integrated (05/15/2022)   Social Connection and Isolation Panel [NHANES]    Frequency of Communication with Friends and Family: Twice a week    Frequency of Social Gatherings with Friends and Family: Once a week    Attends Religious Services: 1 to 4 times per year    Active Member of Clubs or Organizations: No    Attends Archivist Meetings: Never    Marital Status: Married  Human resources officer Violence: Not At Risk (05/15/2022)   Humiliation, Afraid, Rape, and Kick questionnaire    Fear of Current or Ex-Partner: No    Emotionally Abused: No    Physically Abused: No    Sexually Abused: No    Review of Systems: See HPI, otherwise negative ROS  Physical Exam: Vital signs in last 24 hours: Temp:  [98.3 F (36.8 C)] 98.3 F (36.8 C) (01/19 0852) Pulse Rate:  [84] 84 (01/19 0852) Resp:  [18] 18 (01/19 0852) BP: (132)/(74) 132/74 (01/19 0852) SpO2:  [98 %] 98 % (01/19 0852) Weight:  [83.9 kg] 83.9 kg (01/19 0852)   General:   Alert,  Well-developed, well-nourished, pleasant and cooperative in NAD Head:  Normocephalic and atraumatic. Eyes:  Sclera clear, no icterus.   Conjunctiva pink. Ears:  Normal auditory acuity. Nose:  No deformity, discharge,  or lesions. Msk:  Symmetrical without gross deformities. Normal posture. Extremities:  Without clubbing or edema. Neurologic:  Alert and  oriented x4;  grossly normal neurologically. Skin:  Intact without significant lesions or rashes. Psych:  Alert and cooperative. Normal mood and affect.  Impression/Plan: [name] is here for a colonoscopy to be performed for colon cancer screening purposes.  The risks of the procedure including infection, bleed, or perforation as well as benefits, limitations, alternatives and imponderables have been reviewed with the patient. Questions have been answered. All parties agreeable.

## 2022-08-01 NOTE — Discharge Instructions (Addendum)
  Colonoscopy °Discharge Instructions ° °Read the instructions outlined below and refer to this sheet in the next few weeks. These discharge instructions provide you with general information on caring for yourself after you leave the hospital. Your doctor may also give you specific instructions. While your treatment has been planned according to the most current medical practices available, unavoidable complications occasionally occur.  ° °ACTIVITY °You may resume your regular activity, but move at a slower pace for the next 24 hours.  °Take frequent rest periods for the next 24 hours.  °Walking will help get rid of the air and reduce the bloated feeling in your belly (abdomen).  °No driving for 24 hours (because of the medicine (anesthesia) used during the test).   °Do not sign any important legal documents or operate any machinery for 24 hours (because of the anesthesia used during the test).  °NUTRITION °Drink plenty of fluids.  °You may resume your normal diet as instructed by your doctor.  °Begin with a light meal and progress to your normal diet. Heavy or fried foods are harder to digest and may make you feel sick to your stomach (nauseated).  °Avoid alcoholic beverages for 24 hours or as instructed.  °MEDICATIONS °You may resume your normal medications unless your doctor tells you otherwise.  °WHAT YOU CAN EXPECT TODAY °Some feelings of bloating in the abdomen.  °Passage of more gas than usual.  °Spotting of blood in your stool or on the toilet paper.  °IF YOU HAD POLYPS REMOVED DURING THE COLONOSCOPY: °No aspirin products for 7 days or as instructed.  °No alcohol for 7 days or as instructed.  °Eat a soft diet for the next 24 hours.  °FINDING OUT THE RESULTS OF YOUR TEST °Not all test results are available during your visit. If your test results are not back during the visit, make an appointment with your caregiver to find out the results. Do not assume everything is normal if you have not heard from your  caregiver or the medical facility. It is important for you to follow up on all of your test results.  °SEEK IMMEDIATE MEDICAL ATTENTION IF: °You have more than a spotting of blood in your stool.  °Your belly is swollen (abdominal distention).  °You are nauseated or vomiting.  °You have a temperature over 101.  °You have abdominal pain or discomfort that is severe or gets worse throughout the day.  ° °Your colonoscopy was relatively unremarkable.  I did not find any polyps or evidence of colon cancer.  I recommend repeating colonoscopy in 10 years for colon cancer screening purposes.   Follow-up with GI as needed. ° ° °I hope you have a great rest of your week! ° °Charles K. Carver, D.O. °Gastroenterology and Hepatology °[facility] ° °

## 2022-08-01 NOTE — Transfer of Care (Signed)
Immediate Anesthesia Transfer of Care Note  Patient: [name]  Procedure(s) Performed: COLONOSCOPY WITH PROPOFOL  Patient Location: Endoscopy Unit  Anesthesia Type:General  Level of Consciousness: awake, alert , and oriented  Airway & Oxygen Therapy: Patient Spontanous Breathing  Post-op Assessment: Report given to RN and Post -op Vital signs reviewed and stable  Post vital signs: Reviewed and stable  Last Vitals:  Vitals Value Taken Time  BP 102/50 08/01/22 1002  Temp 36.8 C 08/01/22 1001  Pulse 92 08/01/22 1002  Resp 14 08/01/22 1002  SpO2 98 % 08/01/22 1002  Vitals shown include unvalidated device data.  Last Pain:  Vitals:   08/01/22 1001  TempSrc: Axillary  PainSc: 0-No pain      Patients Stated Pain Goal: 6 (36/62/94 7654)  Complications: No notable events documented.

## 2022-08-06 ENCOUNTER — Encounter (HOSPITAL_COMMUNITY): Payer: Self-pay | Admitting: Internal Medicine

## 2022-08-25 ENCOUNTER — Encounter: Payer: Self-pay | Admitting: Family Medicine

## 2022-08-26 LAB — BASIC METABOLIC PANEL
BUN/Creatinine Ratio: 18 (ref 9–23)
BUN: 16 mg/dL (ref 6–24)
CO2: 19 mmol/L — ABNORMAL LOW (ref 20–29)
Calcium: 9.2 mg/dL (ref 8.7–10.2)
Chloride: 104 mmol/L (ref 96–106)
Creatinine, Ser: 0.87 mg/dL (ref 0.57–1.00)
Glucose: 96 mg/dL (ref 70–99)
Potassium: 4.2 mmol/L (ref 3.5–5.2)
Sodium: 138 mmol/L (ref 134–144)
eGFR: 82 mL/min/{1.73_m2} (ref >59)

## 2022-08-26 LAB — LIPID PANEL
Chol/HDL Ratio: 3.6 ratio (ref 0.0–4.4)
Cholesterol, Total: 198 mg/dL (ref 100–199)
HDL: 55 mg/dL (ref >39)
LDL Chol Calc (NIH): 130 mg/dL — ABNORMAL HIGH (ref 0–99)
Triglycerides: 74 mg/dL (ref 0–149)
VLDL Cholesterol Cal: 13 mg/dL (ref 5–40)

## 2022-08-26 LAB — HEPATIC FUNCTION PANEL
ALT: 14 IU/L (ref 0–32)
AST: 16 IU/L (ref 0–40)
Albumin: 4.1 g/dL (ref 3.9–4.9)
Alkaline Phosphatase: 68 IU/L (ref 44–121)
Bilirubin Total: 0.5 mg/dL (ref 0.0–1.2)
Bilirubin, Direct: <0.1 mg/dL (ref 0.00–0.40)
Total Protein: 6.9 g/dL (ref 6.0–8.5)

## 2022-08-26 LAB — VITAMIN D 25 HYDROXY (VIT D DEFICIENCY, FRACTURES): Vit D, 25-Hydroxy: 71.6 ng/mL (ref 30.0–100.0)

## 2022-09-03 ENCOUNTER — Ambulatory Visit: Payer: 59 | Admitting: Family Medicine

## 2022-09-03 VITALS — BP 138/87 | Ht 67.25 in | Wt 190.4 lb

## 2022-09-03 DIAGNOSIS — F439 Reaction to severe stress, unspecified: Secondary | ICD-10-CM | POA: Diagnosis not present

## 2022-09-03 DIAGNOSIS — F411 Generalized anxiety disorder: Secondary | ICD-10-CM | POA: Diagnosis not present

## 2022-09-03 DIAGNOSIS — G4700 Insomnia, unspecified: Secondary | ICD-10-CM | POA: Diagnosis not present

## 2022-09-03 DIAGNOSIS — E785 Hyperlipidemia, unspecified: Secondary | ICD-10-CM | POA: Diagnosis not present

## 2022-09-03 NOTE — Progress Notes (Signed)
   Subjective:    Patient ID: [name], female    DOB: 1975/10/31, 47 y.o.   MRN: [id number]  HPI Patient arrives today to establish care.  Patient gets her wellness through the gynecologist She relates that she does have a lot of anxiousness and feeling worried about things has a hard time turning her brain off at night when she wakes up around 2 or 3 in the morning has a hard time falling back to sleep sometimes uses television in the room. Patient has Editor, commissioning.   Patient is concerned about her anxiety and sleeplessness.      Review of Systems     Objective:   Physical Exam Lungs clear heart regular pulse normal extremities no edema       Assessment & Plan:  1. GAD (generalized anxiety disorder) Recommend changing medicine.  Patient will be taking a questionnaire regarding OCD is possible there could be multiple issues going on here might benefit better from Luvox  2. Insomnia, unspecified type We did talk about behavioral ways of trying to improve sleep trazodone is fine for now avoiding EMB and nerve pills that type of measure behavioral approaches for sleep discussed  3. Stress It is important that the patient developed some healthy habits that dissipating some of her responsibilities or at least cutting down on how much she stresses about these issues especially with eating time that time  4. Hyperlipidemia, unspecified hyperlipidemia type LDL slightly up but she is blessed with a decent amount of good HDL healthy diet recommended  I did give her forms regarding OCP she will fill these out and send her back to [place] potentially try Luvox

## 2022-09-08 ENCOUNTER — Encounter: Payer: Self-pay | Admitting: Family Medicine

## 2022-09-09 ENCOUNTER — Other Ambulatory Visit: Payer: Self-pay

## 2022-09-09 MED ORDER — FLUOXETINE HCL 20 MG PO CAPS: 20.0000 mg | ORAL_CAPSULE | Freq: Every day | ORAL | 3 refills | Status: DC

## 2022-09-09 NOTE — Telephone Encounter (Signed)
Nurses #1 please send in Prozac 20 mg 1 daily, #30, 3 refills #2 discontinue sertraline Most people do well with the medication switch, our goals is to see improvement with how she is doing in regards to worry.  Over time we can adjust the dose of the medicine but the initial dose is 20 mg. #3 send he is to send [place] a MyChart update within 3 to 4 weeks letting me know how she is doing #4 recommend follow-up office visit in 3 months  Thanks-Dr. Nicki Reaper

## 2022-09-11 ENCOUNTER — Encounter: Payer: Self-pay | Admitting: Radiology

## 2022-09-17 ENCOUNTER — Encounter: Payer: Self-pay | Admitting: Family Medicine

## 2022-09-24 ENCOUNTER — Telehealth: Payer: Self-pay

## 2022-09-24 NOTE — Telephone Encounter (Signed)
Nurses-please let the patient know that he did review this message with me I did review over Dinita's message I also reviewed over what Dr. Lacinda Axon stated plus also one of my previous messages.  Within my previous message I did recommend to consider reducing her Prozac from 20 mg down to 10 mg I would recommend that we do this #30, 10 mg, 1 daily, stop 20 mg, I can easily do a follow-up visit with her somewhere within the next couple weeks if she prefers that to be video visit we can do that close call lunchtime-I will let you all schedule thank you

## 2022-09-24 NOTE — Telephone Encounter (Signed)
Called patient back she stated that the Prozac she is taking is making her sleepy, and having mood swings. Patient stated she did not feel like she would harm herself or others. She just was concerned that maybe the Prozac was not the correct dose. Patient states that due to scheduling issues she would like to do video visit if possible. Please advise.

## 2022-09-24 NOTE — Telephone Encounter (Signed)
Patient advised per Dr Lacinda Axon Recommend decrease to 10 mg daily. Okay to send new Rx. Patient deferred recommendations and would like to wait until Dr Nicki Reaper gets back.

## 2022-09-24 NOTE — Telephone Encounter (Signed)
Pt is calling wanting a nurse to call back about the FLUoxetine (PROZAC) 20 MG capsule she has a question about medication would not go in to detail.   Call back [place]

## 2022-09-25 NOTE — Telephone Encounter (Signed)
Patient stated she does not feel like the med is working and decreasing would not help. Patient scheduled phone visit next week to discuss other options.

## 2022-09-25 NOTE — Telephone Encounter (Signed)
I agree with the approach.  Stop current medication.  Discuss further options next week-I recommend this be a virtual visit if virtual does not work we will do a phone visit thank you-please make sure that on the schedule it does have the ability to do or at least try a video visit thank you

## 2022-10-02 ENCOUNTER — Ambulatory Visit (INDEPENDENT_AMBULATORY_CARE_PROVIDER_SITE_OTHER): Payer: 59 | Admitting: Family Medicine

## 2022-10-02 DIAGNOSIS — F411 Generalized anxiety disorder: Secondary | ICD-10-CM | POA: Insufficient documentation

## 2022-10-02 MED ORDER — SERTRALINE HCL 100 MG PO TABS: 100.0000 mg | ORAL_TABLET | Freq: Every day | ORAL | 3 refills | Status: DC

## 2022-10-02 NOTE — Progress Notes (Signed)
   Subjective:    Patient ID: [name], female    DOB: 1976-04-27, 47 y.o.   MRN: [id number]  HPI Patient calls to follow up on Prozac- the patient does not feel the medication is working well for her and having side effects at higher dose  Very nice patient Have a difficult time with Prozac Medications causing drowsiness as well as nausea and irritability and her anxiety is not improving She has been on multiple medicines including Paxil sertraline Lexapro.  She had questions regarding Wellbutrin as well as Celexa.  Patient denies being depressed but does find yourself having nausea just not feeling good low energy Virtual Visit via Video Note hey [name] this is Dr.  Meda Coffee connected with [name] on 10/02/22 at 11:40 AM EDT by a video enabled telemedicine application and verified that I am speaking with the correct person using two identifiers.  Location: Patient: home Provider: office   I discussed the limitations of evaluation and management by telemedicine and the availability of in person appointments. The patient expressed understanding and agreed to proceed.  History of Present Illness:    Observations/Objective:   Assessment and Plan:   Follow Up Instructions:    I discussed the assessment and treatment plan with the patient. The patient was provided an opportunity to ask questions and all were answered. The patient agreed with the plan and demonstrated an understanding of the instructions.   The patient was advised to call back or seek an in-person evaluation if the symptoms worsen or if the condition fails to improve as anticipated.  I provided 15 minutes of non-face-to-face time during this encounter.     Review of Systems     Objective:   Physical Exam Patient had virtual visit-video Appears to be in no distress Atraumatic Neuro able to relate and oriented No apparent resp distress Color normal        Assessment & Plan:   GAD I do not  find evidence of depression We did discuss various options After shared discussion we will try sertraline again it seemed to help her but she states after a while it ran out of potency she was on 50 mg at that time Therefore we will go with sertraline 100 mg she will take a half a tablet once a day for the first 7 days then after the first 7 days switch over to 1 tablet daily she will do a follow-up visit in approximately 12 weeks but she will send [place] a MyChart message or phone call in the approximately 10 to 14 days time follow-up sooner if any problems or not tolerating the medicine

## 2022-10-14 ENCOUNTER — Encounter: Payer: Self-pay | Admitting: Family Medicine

## 2022-12-23 ENCOUNTER — Encounter (HOSPITAL_COMMUNITY): Payer: Self-pay

## 2022-12-23 ENCOUNTER — Ambulatory Visit (HOSPITAL_COMMUNITY)
Admission: RE | Admit: 2022-12-23 | Discharge: 2022-12-23 | Disposition: A | Discharge status: Home / Self Care | Payer: 59 | Source: Ambulatory Visit | Attending: Adult Health | Admitting: Adult Health

## 2022-12-23 DIAGNOSIS — N6310 Unspecified lump in the right breast, unspecified quadrant: Secondary | ICD-10-CM | POA: Diagnosis not present

## 2022-12-23 DIAGNOSIS — R928 Other abnormal and inconclusive findings on diagnostic imaging of breast: Secondary | ICD-10-CM | POA: Diagnosis present

## 2023-03-10 ENCOUNTER — Other Ambulatory Visit: Payer: Self-pay | Admitting: Family Medicine

## 2023-03-25 ENCOUNTER — Other Ambulatory Visit (HOSPITAL_COMMUNITY): Payer: Self-pay | Admitting: Adult Health

## 2023-03-25 DIAGNOSIS — Z1231 Encounter for screening mammogram for malignant neoplasm of breast: Secondary | ICD-10-CM

## 2023-04-13 ENCOUNTER — Other Ambulatory Visit: Payer: Self-pay | Admitting: Family Medicine

## 2023-05-06 IMAGING — DX DG KNEE COMPLETE 4+V*R*
4 series · 4 of 4 positions shown · non-contrast
Comparison: 08/31/2019

CLINICAL DATA: Knee pain.  History of patellar surgery [DATE].

EXAM:
RIGHT KNEE - COMPLETE 4+ VIEW

[knee ap]
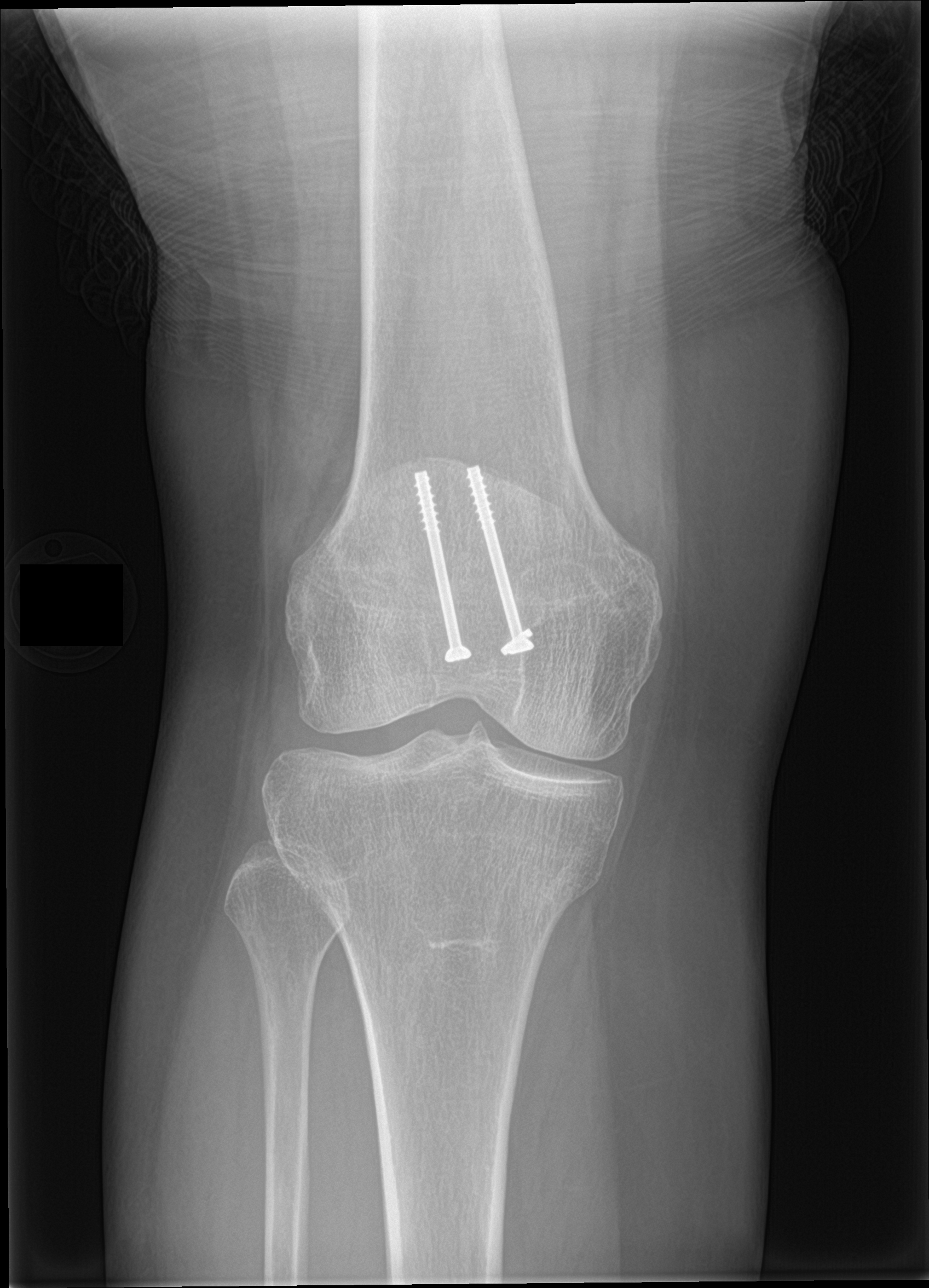

[knee obl (1 of 2)]
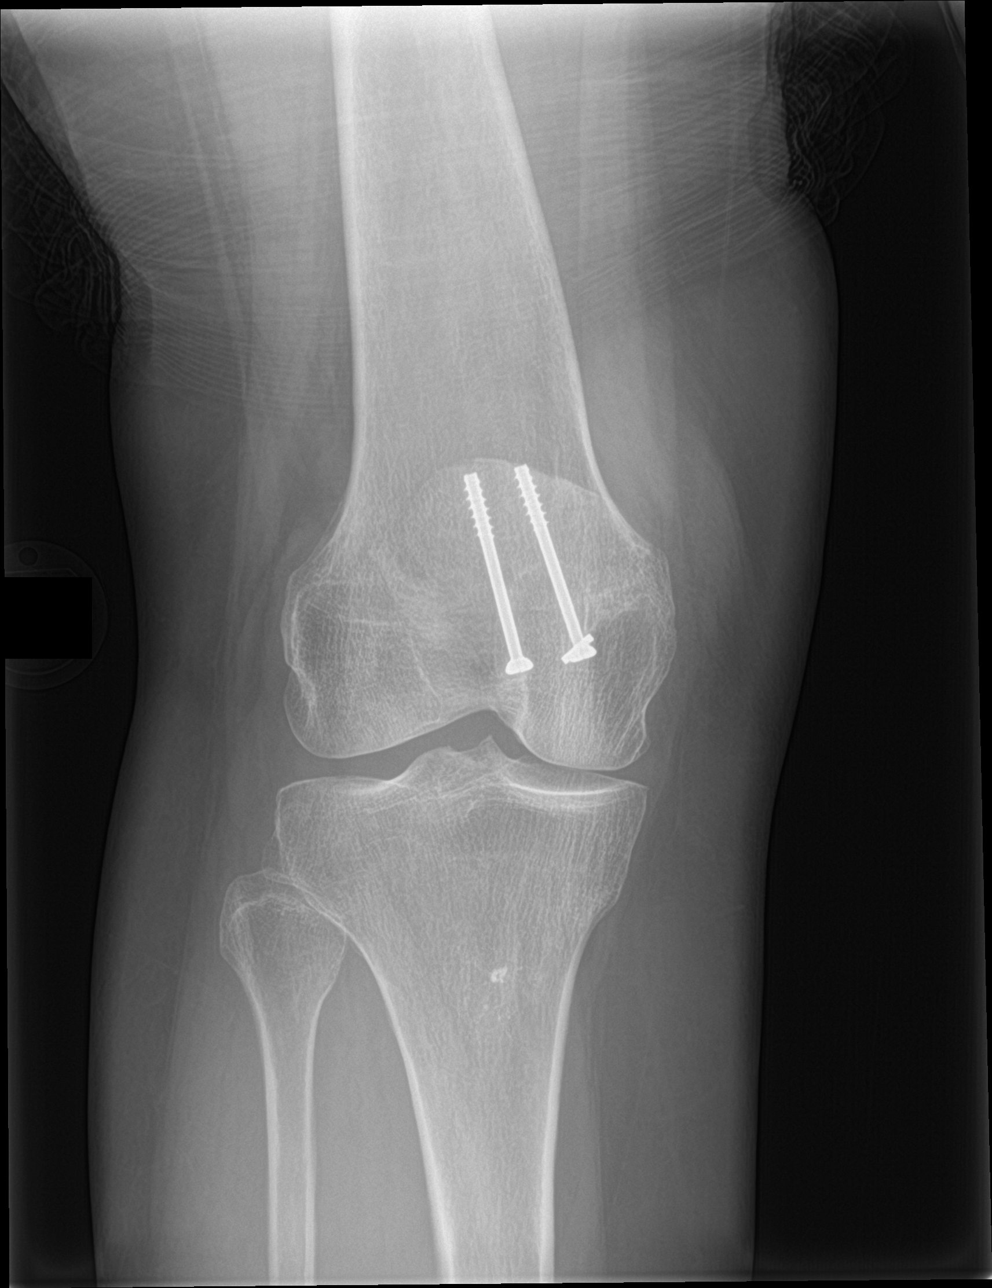

[knee obl (2 of 2)]
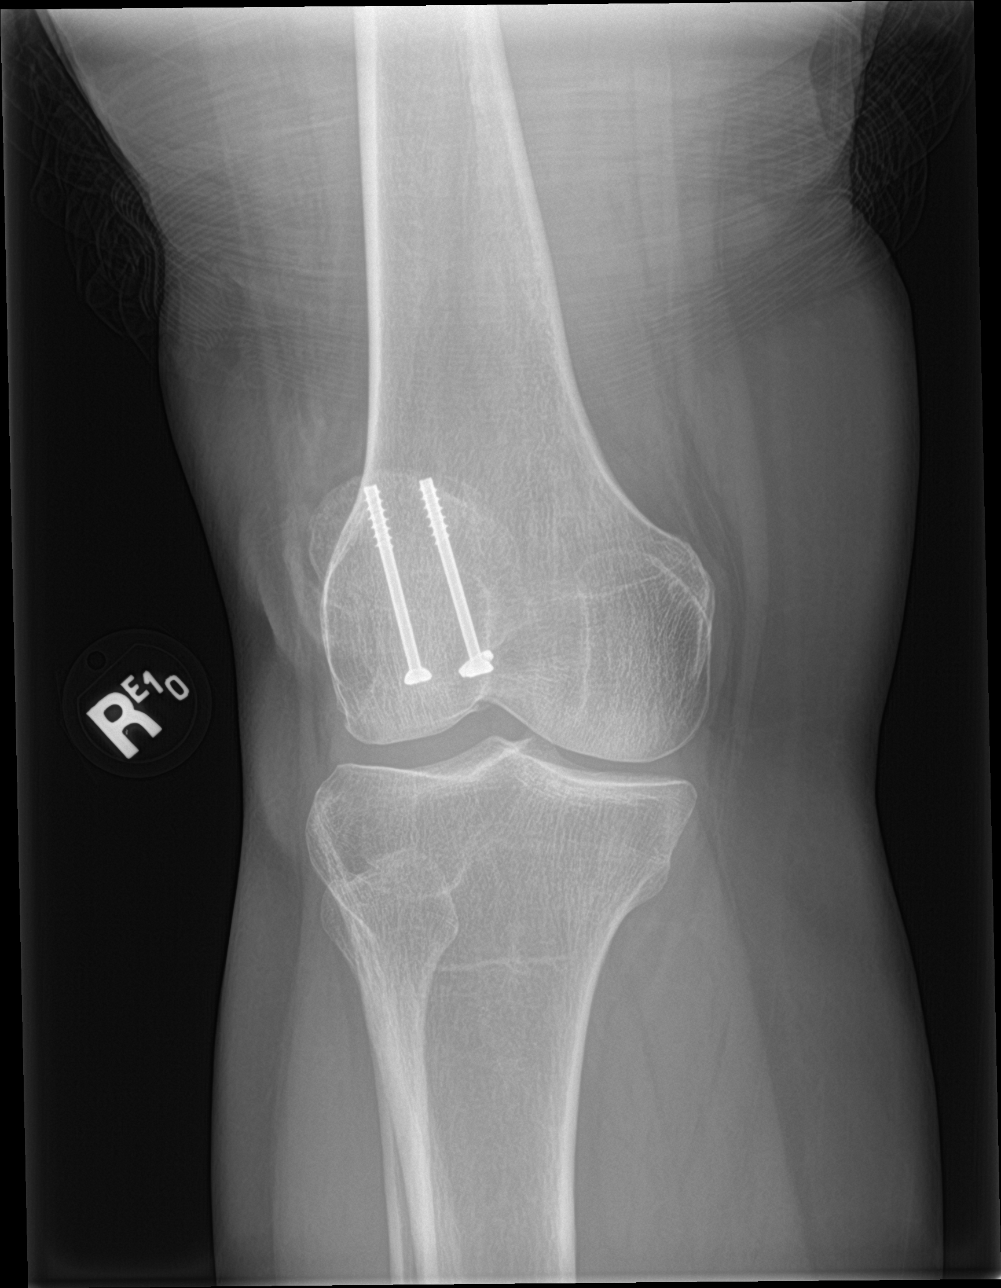

[knee lat]
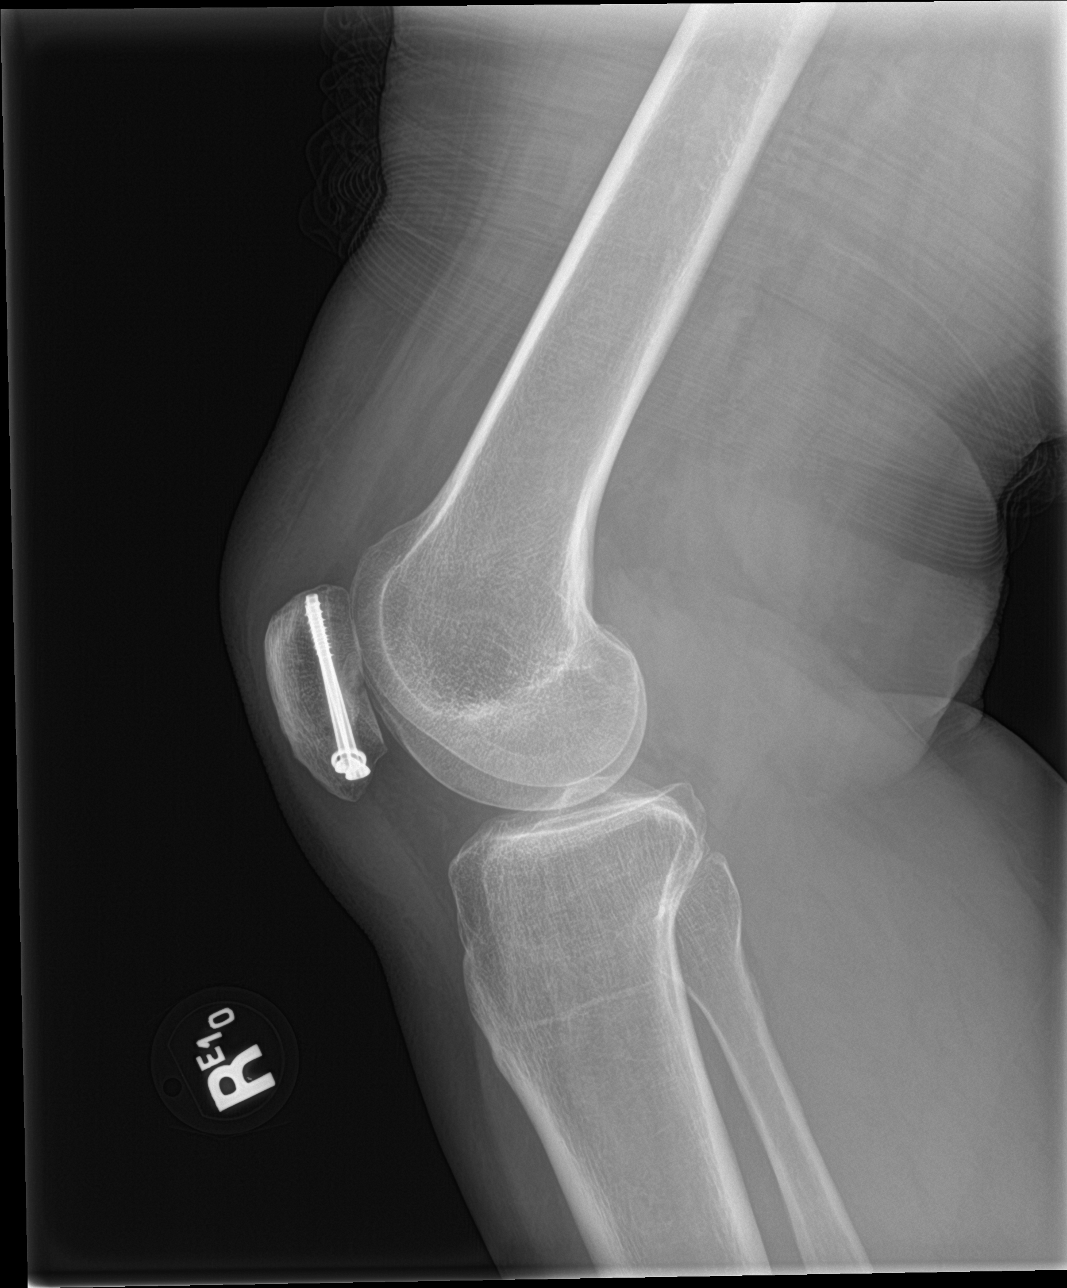

[4 of 4 positions shown; findings below may reference images not displayed]

FINDINGS: There are 2 new vertical oriented screws from inferior approach
traversing the patella. There is marked interval improvement in now
near anatomic alignment of the patella, with resolution of the prior
transverse dimension fracture and craniocaudal diastasis. Mild
patellofemoral joint space narrowing with likely mild subchondral
degenerative cystic change at the mid height. No joint effusion. No
acute fracture or dislocation.
IMPRESSION: Interval ORIF of the patella with improved, now near anatomic
alignment. Mild patellofemoral osteoarthritis.

## 2023-05-18 ENCOUNTER — Ambulatory Visit: Payer: 59 | Admitting: Adult Health

## 2023-06-14 ENCOUNTER — Other Ambulatory Visit: Payer: Self-pay | Admitting: Family Medicine

## 2023-06-18 ENCOUNTER — Ambulatory Visit (HOSPITAL_COMMUNITY)
Admission: RE | Admit: 2023-06-18 | Discharge: 2023-06-18 | Disposition: A | Discharge status: Home / Self Care | Payer: 59 | Source: Ambulatory Visit | Attending: Adult Health | Admitting: Adult Health

## 2023-06-18 DIAGNOSIS — Z1231 Encounter for screening mammogram for malignant neoplasm of breast: Secondary | ICD-10-CM | POA: Insufficient documentation

## 2023-06-24 ENCOUNTER — Ambulatory Visit: Payer: 59 | Admitting: Adult Health

## 2023-07-01 ENCOUNTER — Encounter: Payer: Self-pay | Admitting: Adult Health

## 2023-07-01 ENCOUNTER — Ambulatory Visit: Payer: 59 | Admitting: Adult Health

## 2023-07-01 VITALS — BP 137/86 | HR 83 | Ht 68.0 in | Wt 198.0 lb

## 2023-07-01 DIAGNOSIS — G479 Sleep disorder, unspecified: Secondary | ICD-10-CM | POA: Diagnosis not present

## 2023-07-01 DIAGNOSIS — Z01419 Encounter for gynecological examination (general) (routine) without abnormal findings: Secondary | ICD-10-CM | POA: Diagnosis not present

## 2023-07-01 DIAGNOSIS — N921 Excessive and frequent menstruation with irregular cycle: Secondary | ICD-10-CM | POA: Diagnosis not present

## 2023-07-01 DIAGNOSIS — Z1211 Encounter for screening for malignant neoplasm of colon: Secondary | ICD-10-CM | POA: Diagnosis not present

## 2023-07-01 DIAGNOSIS — K5900 Constipation, unspecified: Secondary | ICD-10-CM | POA: Insufficient documentation

## 2023-07-01 LAB — HEMOCCULT GUIAC POC 1CARD (OFFICE): Fecal Occult Blood, POC: NEGATIVE

## 2023-07-01 MED ORDER — LINACLOTIDE 145 MCG PO CAPS: 145.0000 ug | ORAL_CAPSULE | Freq: Every day | ORAL | 6 refills | Status: AC

## 2023-07-01 MED ORDER — NORETHINDRONE ACETATE 5 MG PO TABS: 5.0000 mg | ORAL_TABLET | Freq: Every day | ORAL | 3 refills | Status: DC

## 2023-07-01 NOTE — Progress Notes (Signed)
Patient ID: KAJSA HIRONS, female   DOB: 1975/12/05, 47 y.o.   MRN: [id number] History of Present Illness: [name] is a 47 year old white female, married, [id number] in for a well woman gyn exam.      Component Value Date/Time   DIAGPAP  02/25/2021 1433    - Negative for intraepithelial lesion or malignancy (NILM)   DIAGPAP  10/24/2016 0000    NEGATIVE FOR INTRAEPITHELIAL LESIONS OR MALIGNANCY.   HPVHIGH Negative 02/25/2021 1433   ADEQPAP  02/25/2021 1433    Satisfactory for evaluation; transformation zone component ABSENT.   ADEQPAP  10/24/2016 0000    Satisfactory for evaluation  endocervical/transformation zone component ABSENT.    PCP is Dr Lilyan Punt   Current Medications, Allergies, Past Medical History, Past Surgical History, Family History and Social History were reviewed in [employer] electronic medical record.     Review of Systems: Patient denies any headaches, hearing loss, fatigue, blurred vision, shortness of breath, chest pain, abdominal pain, problems with bowel movements(has constipation if not on linzess), urination, or intercourse. No joint pain or mood swings.  She says periods are heavy, lasts 7-8 days with 5-6 heavy, changes pads and tampons every hour, even feels lightheaded at times Does not sleep well, has tried atarax,trazodone, and xanax, melatonin helps some  Physical Exam:BP 137/86 (BP Location: Right Arm, Patient Position: Sitting, Cuff Size: Normal)   Pulse 83   Ht 5\' 8"  (1.727 m)   Wt 198 lb (89.8 kg)   LMP 06/10/2023 (Approximate)   BMI 30.11 kg/m   General:  Well developed, well nourished, no acute distress Skin:  Warm and dry Neck:  Midline trachea, normal thyroid, good ROM, no lymphadenopathy Lungs; Clear to auscultation bilaterally Breast:  No dominant palpable mass, retraction, or nipple discharge Cardiovascular: Regular rate and rhythm Abdomen:  Soft, non tender, no hepatosplenomegaly Pelvic:  External genitalia is normal in  appearance, no lesions.  The vagina is normal in appearance. Urethra has no lesions or masses. The cervix is bulbous.  Uterus is felt to be normal size, shape, and contour.  No adnexal masses or tenderness noted.Bladder is non tender, no masses felt. Rectal: Good sphincter tone, no polyps, or hemorrhoids felt.  Hemoccult negative. Extremities/musculoskeletal:  No swelling or varicosities noted, no clubbing or cyanosis Psych:  No mood changes, alert and cooperative,seems happy AA is 0 Fall risk is low    07/01/2023    2:33 PM 09/03/2022    1:44 PM 05/15/2022   11:49 AM  Depression screen PHQ 2/9  Decreased Interest 0 2 1  Down, Depressed, Hopeless 1 1 0  PHQ - 2 Score 1 3 1   Altered sleeping 3 3 3   Tired, decreased energy 1 1 2   Change in appetite 0 0 0  Feeling bad or failure about yourself  0 0 0  Trouble concentrating 1 2 2   Moving slowly or fidgety/restless 0 0 0  Suicidal thoughts 0 0 0  PHQ-9 Score 6 9 8   Difficult doing work/chores  Somewhat difficult    On zoloft    07/01/2023    2:34 PM 09/03/2022    1:45 PM 05/15/2022   11:49 AM 11/04/2021    1:58 PM  GAD 7 : Generalized Anxiety Score  Nervous, Anxious, on Edge 1 2 2 2   Control/stop worrying 1 2 1 2   Worry too much - different things 1 2 1 2   Trouble relaxing 2 2 2 2   Restless 0 1 1 1   Easily  annoyed or irritable 1 2 1 1   Afraid - awful might happen 1 2 0 2  Total GAD 7 Score 7 13 8 12   Anxiety Difficulty  Very difficult  Somewhat difficult      Upstream - 07/01/23 1440       Pregnancy Intention Screening   Does the patient want to become pregnant in the next year? No    Would the patient like to discuss contraceptive options today? N/A      Contraception Wrap Up   Current Method Female Sterilization   tubal   End Method Female Sterilization    Contraception Counseling Provided No            Examination chaperoned by Freddie Apley RN  Impression and Plan:  1. Encounter for well woman exam with  routine gynecological exam (Primary) Pap and physical in 1 year Mammogram was negative 06/18/23 Colonoscopy per GI  Labs with PCP 2. Encounter for screening fecal occult blood testing Hemoccult was negative  - POCT occult blood stool  3. Sleep disturbance  4. Menorrhagia with irregular cycle Will try aygestin 5 mg 1 daily to see if can stop period Meds ordered this encounter  Medications   norethindrone (AYGESTIN) 5 MG tablet    Sig: Take 1 tablet (5 mg total) by mouth daily.    Dispense:  30 tablet    Refill:  3    Supervising Provider:   Lazaro Arms [[id number]]   linaclotide (LINZESS) 145 MCG CAPS capsule    Sig: Take 1 capsule (145 mcg total) by mouth daily before breakfast.    Dispense:  30 capsule    Refill:  6    Supervising Provider:   Duane Lope H [[id number]]   Follow up in 3 months for ROS on bleeding   5. Constipation, unspecified constipation type Good with linzess, will continue

## 2023-07-17 ENCOUNTER — Other Ambulatory Visit: Payer: Self-pay | Admitting: Adult Health

## 2023-07-17 MED ORDER — ZOLPIDEM TARTRATE 5 MG PO TABS: 5.0000 mg | ORAL_TABLET | Freq: Every evening | ORAL | 0 refills | Status: DC | PRN

## 2023-07-17 NOTE — Progress Notes (Signed)
 Will rx Ambien 5 mg

## 2023-09-06 ENCOUNTER — Other Ambulatory Visit: Payer: Self-pay | Admitting: Family Medicine

## 2023-09-07 ENCOUNTER — Encounter: Payer: Self-pay | Admitting: Family Medicine

## 2023-09-17 ENCOUNTER — Ambulatory Visit: Payer: 59 | Admitting: Obstetrics & Gynecology

## 2023-09-23 ENCOUNTER — Other Ambulatory Visit: Payer: Self-pay | Admitting: Adult Health

## 2023-09-29 ENCOUNTER — Ambulatory Visit: Payer: 59 | Admitting: Adult Health

## 2023-10-14 ENCOUNTER — Ambulatory Visit: Payer: Self-pay | Admitting: Obstetrics & Gynecology

## 2023-11-17 ENCOUNTER — Encounter: Payer: Self-pay | Admitting: Family Medicine

## 2023-11-17 ENCOUNTER — Other Ambulatory Visit: Payer: Self-pay | Admitting: Family Medicine

## 2023-11-17 MED ORDER — SERTRALINE HCL 100 MG PO TABS: 100.0000 mg | ORAL_TABLET | Freq: Every day | ORAL | 3 refills | Status: DC

## 2023-11-17 NOTE — Telephone Encounter (Signed)
 Nurses I sent in a prescription for her medication enough to last for the next 3 months Please work with the front to reschedule her appointment to a day that she can come to the office to be seen in person preferably with me or with Orelia Binet In addition to that I would recommend lipid as well as metabolic 7 before follow-up visit-diagnosis hyperlipidemia and high risk med  If she has any needs before then feel free for her to reach out to us  thank you-Dr. Geralyn Knee

## 2023-11-18 ENCOUNTER — Encounter: Payer: Self-pay | Admitting: Family Medicine

## 2023-11-18 NOTE — Telephone Encounter (Signed)
 Lipid, liver, metabolic 7 Hyperlipidemia, high risk med  She may keep her appointment in June that would be good thank you so much-Dr. Geralyn Knee  Please notify patient to do lab work through [facility] orders will be put in by the nursing staff

## 2023-11-18 NOTE — Telephone Encounter (Signed)
 See other message- patient has decided to keep June 2nd appointment

## 2023-11-19 ENCOUNTER — Other Ambulatory Visit: Payer: Self-pay

## 2023-11-19 DIAGNOSIS — Z79899 Other long term (current) drug therapy: Secondary | ICD-10-CM

## 2023-11-19 DIAGNOSIS — E785 Hyperlipidemia, unspecified: Secondary | ICD-10-CM

## 2023-11-24 ENCOUNTER — Other Ambulatory Visit: Payer: Self-pay | Admitting: Adult Health

## 2023-12-01 ENCOUNTER — Ambulatory Visit: Payer: Self-pay | Admitting: Family Medicine

## 2023-12-01 LAB — BASIC METABOLIC PANEL WITH GFR
BUN/Creatinine Ratio: 16 (ref 9–23)
BUN: 15 mg/dL (ref 6–24)
CO2: 19 mmol/L — ABNORMAL LOW (ref 20–29)
Calcium: 8.8 mg/dL (ref 8.7–10.2)
Chloride: 102 mmol/L (ref 96–106)
Creatinine, Ser: 0.95 mg/dL (ref 0.57–1.00)
Glucose: 91 mg/dL (ref 70–99)
Potassium: 4.1 mmol/L (ref 3.5–5.2)
Sodium: 137 mmol/L (ref 134–144)
eGFR: 73 mL/min/{1.73_m2} (ref >59)

## 2023-12-01 LAB — LIPID PANEL
Chol/HDL Ratio: 4.3 ratio (ref 0.0–4.4)
Cholesterol, Total: 220 mg/dL — ABNORMAL HIGH (ref 100–199)
HDL: 51 mg/dL (ref >39)
LDL Chol Calc (NIH): 145 mg/dL — ABNORMAL HIGH (ref 0–99)
Triglycerides: 136 mg/dL (ref 0–149)
VLDL Cholesterol Cal: 24 mg/dL (ref 5–40)

## 2023-12-01 LAB — HEPATIC FUNCTION PANEL
ALT: 15 IU/L (ref 0–32)
AST: 14 IU/L (ref 0–40)
Albumin: 4 g/dL (ref 3.9–4.9)
Alkaline Phosphatase: 77 IU/L (ref 44–121)
Bilirubin Total: 0.4 mg/dL (ref 0.0–1.2)
Bilirubin, Direct: 0.12 mg/dL (ref 0.00–0.40)
Total Protein: 6.9 g/dL (ref 6.0–8.5)

## 2023-12-15 ENCOUNTER — Encounter: Payer: 59 | Admitting: Family Medicine

## 2023-12-24 ENCOUNTER — Telehealth: Payer: Self-pay | Admitting: Orthopedic Surgery

## 2023-12-24 NOTE — Telephone Encounter (Signed)
 Dr. Delfino Fellers pt - spoke w/the pt, she is having severe rt knee pain, unable to sleep the last three nights.  She is requesting a script for Gabapentin  to be sent to [place] if possible.  [phone]

## 2023-12-24 NOTE — Telephone Encounter (Signed)
 Called the pt and lvm advising per Dr. Marvina Slough

## 2024-01-01 ENCOUNTER — Other Ambulatory Visit: Payer: Self-pay | Admitting: Adult Health

## 2024-02-05 ENCOUNTER — Ambulatory Visit: Payer: Self-pay

## 2024-02-05 ENCOUNTER — Encounter: Payer: Self-pay | Admitting: Family Medicine

## 2024-02-05 NOTE — Telephone Encounter (Signed)
 FYI Only or Action Required?: FYI only for provider.  Patient was last seen in primary care on 10/02/2022 by Alphonsa Glendia LABOR, MD.  Called Nurse Triage reporting Anxiety.  Symptoms began several months ago.  Interventions attempted: Prescription medications: zoloft  and Rest, hydration, or home remedies.  Symptoms are: unchanged.  Triage Disposition: See PCP Within 2 Weeks-patient requested next available appointment with Samantha who she has been speaking with in regards to her anxiety.   Patient/caregiver understands and will follow disposition?: Yes  Copied from CRM [phone] [id number]. Topic: Clinical - Red Word Triage >> Feb 05, 2024  4:16 PM [name] wrote: Anxiety: Pt has spoken with NP Dorthea who instructed her to schedule an appointment. [facility] is blocking as her symptoms are getting worst Reason for Disposition  [1] Symptoms of anxiety or panic attack AND [2] is a chronic symptom (recurrent or ongoing AND present > 4 weeks)  Answer Assessment - Initial Assessment Questions 1. CONCERN: Did anything happen that prompted you to call today?      Patient has been speaking with Elveria NP in the office in regards to her anxiety 2. ANXIETY SYMPTOMS: Can you describe how you (your loved one; patient) have been feeling? (e.g., tense, restless, panicky, anxious, keyed up, overwhelmed, sense of impending doom).      Restless, anxious, overwhelmed 3. ONSET: How long have you been feeling this way? (e.g., hours, days, weeks)     Going on for awhile 4. SEVERITY: How would you rate the level of anxiety? (e.g., 0 - 10; or mild, moderate, severe).     Moderate-Severe  5. FUNCTIONAL IMPAIRMENT: How have these feelings affected your ability to do daily activities? Have you had more difficulty than usual doing your normal daily activities? (e.g., getting better, same, worse; self-care, school, work, interactions)     Some what per patient 6. HISTORY: Have you felt this way before? Have  you ever been diagnosed with an anxiety problem in the past? (e.g., generalized anxiety disorder, panic attacks, PTSD). If Yes, ask: How was this problem treated? (e.g., medicines, counseling, etc.)     yes 7. RISK OF HARM - SUICIDAL IDEATION: Do you ever have thoughts of hurting or killing yourself? If Yes, ask:  Do you have these feelings now? Do you have a plan on how you would do this?     no 8. TREATMENT:  What has been done so far to treat this anxiety? (e.g., medicines, relaxation strategies). What has helped?     medications 9. THERAPIST: Do you have a counselor or therapist? If Yes, ask: What is their name?     N/A 10. POTENTIAL TRIGGERS: Do you drink caffeinated beverages (e.g., coffee, colas, teas), and how much daily? Do you drink alcohol or use any drugs? Have you started any new medicines recently?       no 11. PATIENT SUPPORT: Who is with you now? Who do you live with? Do you have family or friends who you can talk to?        family 24. OTHER SYMPTOMS: Do you have any other symptoms? (e.g., feeling depressed, trouble concentrating, trouble sleeping, trouble breathing, palpitations or fast heartbeat, chest pain, sweating, nausea, or diarrhea)       No  Patient has been messaging with Samantha Quarry NP today on mychart. Patient was told to call in to make an appointment. Patient reports ongoing anxiety and is requesting to see Samantha for her next available appointment.  Explained to patient that sooner  appointments are available on 02/09/2024-patient states I am fine waiting-I know Samantha.  Patient scheduled for 02/29/2024 at 1:50 PM and placed on the wait list.  Protocols used: Anxiety and Panic Attack-A-AH

## 2024-02-08 ENCOUNTER — Encounter: Payer: Self-pay | Admitting: *Deleted

## 2024-02-08 NOTE — Telephone Encounter (Signed)
Appointment scheduled and patient notified via my chart

## 2024-02-12 ENCOUNTER — Encounter: Payer: Self-pay | Admitting: Nurse Practitioner

## 2024-02-12 ENCOUNTER — Telehealth (INDEPENDENT_AMBULATORY_CARE_PROVIDER_SITE_OTHER): Payer: Self-pay | Admitting: Nurse Practitioner

## 2024-02-12 DIAGNOSIS — F411 Generalized anxiety disorder: Secondary | ICD-10-CM | POA: Diagnosis not present

## 2024-02-12 DIAGNOSIS — F909 Attention-deficit hyperactivity disorder, unspecified type: Secondary | ICD-10-CM

## 2024-02-12 DIAGNOSIS — G479 Sleep disorder, unspecified: Secondary | ICD-10-CM

## 2024-02-12 MED ORDER — SERTRALINE HCL 100 MG PO TABS: 100.0000 mg | ORAL_TABLET | Freq: Every day | ORAL | 1 refills | Status: AC

## 2024-02-12 MED ORDER — AMPHETAMINE-DEXTROAMPHET ER 10 MG PO CP24: 10.0000 mg | ORAL_CAPSULE | Freq: Every day | ORAL | 0 refills | Status: DC

## 2024-02-12 NOTE — Progress Notes (Signed)
 Virtual Visit via Video Note  I connected with [name] on 02/12/24 at 10:20 AM EDT by a video enabled telemedicine application and verified that I am speaking with the correct person using two identifiers.  Location: Patient: work/car Provider: office    I discussed the limitations of evaluation and management by telemedicine and the availability of in person appointments. The patient expressed understanding and agreed to proceed.  History of Present Illness:  Presents to discuss problems focusing. Her anxiety is worse lately due to family issues. Sertraline  has been working well until recently. States her mind is running all the time. Distracted. Trouble completing tasks. Difficulty sleeping. Has tried Ambien  and Trazodone  with no relief. Take Melatonin which helps her get to sleep. Has had problems with attention and focus most of her life.      02/12/2024    9:54 AM  Depression screen PHQ 2/9  Decreased Interest 0  Down, Depressed, Hopeless 0  PHQ - 2 Score 0  Altered sleeping 3  Tired, decreased energy 2  Change in appetite 0  Feeling bad or failure about yourself  0  Trouble concentrating 3  Moving slowly or fidgety/restless 1  Suicidal thoughts 0  PHQ-9 Score 9  Difficult doing work/chores Not difficult at all      02/12/2024    9:55 AM 07/01/2023    2:34 PM 09/03/2022    1:45 PM 05/15/2022   11:49 AM  GAD 7 : Generalized Anxiety Score  Nervous, Anxious, on Edge 3 1 2 2   Control/stop worrying 3 1 2 1   Worry too much - different things 3 1 2 1   Trouble relaxing 3 2 2 2   Restless 3 0 1 1  Easily annoyed or irritable 3 1 2 1   Afraid - awful might happen  1 2 0  Total GAD 7 Score  7 13 8   Anxiety Difficulty Somewhat difficult  Very difficult    Adult ADHD Self Report Scale (most recent)     Adult ADHD Self-Report Scale (ASRS-v1.1) Symptom Checklist - 02/12/24 1037       Part A   1. How often do you have trouble wrapping up the final details of a project,  once the challenging parts have been done? Very Often  2. How often do you have difficulty getting things done in order when you have to do a task that requires organization? Very Often    3. How often do you have problems remembering appointments or obligations? Often  4. When you have a task that requires a lot of thought, how often do you avoid or delay getting started? Very Often    5. How often do you fidget or squirm with your hands or feet when you have to sit down for a long time? Very Often  6. How often do you feel overly active and compelled to do things, like you were driven by a motor? Very Often      Part B   7. How often do you make careless mistakes when you have to work on a boring or difficult project? Often  8. How often do you have difficulty keeping your attention when you are doing boring or repetitive work? Very Often    9. How often do you have difficulty concentrating on what people say to you, even when they are speaking to you directly? Very Often  10. How often do you misplace or have difficulty finding things at home or at work? Often  11. How often are you distracted by activity or noise around you? Very Often  12. How often do you leave your seat in meetings or other situations in which you are expected to remain seated? Sometimes    13. How often do you feel restless or fidgety? Very Often  14. How often do you have difficulty unwinding and relaxing when you have time to yourself? Very Often    15. How often do you find yourself talking too much when you are in social situations? Very Often  16. When you are in a conversation, how often do you find yourself finishing the sentences of the people you are talking to, before they can finish them themselves? Very Often    17. How often do you have difficulty waiting your turn in situations when turn taking is required? Very Often  18. How often do you interrupt others when they are busy? Very Often      Comment   How old  were you when these problems first began to occur? 12            Social History   Tobacco Use   Smoking status: Never   Smokeless tobacco: Never  Vaping Use   Vaping status: Never Used  Substance Use Topics   Alcohol use: No   Drug use: Never   Denies any suicidal or homicidal thoughts or ideation. Denies any self harm behaviors.    Observations/Objective: NAD. Alert, oriented. Mildly anxious affect. Speech clear. Making good eye contact. Normal judgement and behavior.   Assessment and Plan: Problem List Items Addressed This Visit       Other   Adult ADHD - Primary   GAD (generalized anxiety disorder)   Relevant Medications   sertraline  (ZOLOFT ) 100 MG tablet   Sleep disturbance   Meds ordered this encounter  Medications   sertraline  (ZOLOFT ) 100 MG tablet    Sig: Take 1 tablet (100 mg total) by mouth daily.    Dispense:  90 tablet    Refill:  1   amphetamine-dextroamphetamine (ADDERALL XR) 10 MG 24 hr capsule    Sig: Take 1 capsule (10 mg total) by mouth daily.    Dispense:  30 capsule    Refill:  0    Supervising Provider:   ALPHONSA HAMILTON A [[id number]]   Discussed different issues including anxiety, sleep and ADHD. Continue Zoloft  as directed. Consider long acting Melatonin. Start low dose Adderall. Reviewed potential side effects. DC and contact office if any problems.  Contact us  within the next month for update on how the medication is working.   Follow Up Instructions: Return in about 3 months (around 05/14/2024).    I discussed the assessment and treatment plan with the patient. The patient was provided an opportunity to ask questions and all were answered. The patient agreed with the plan and demonstrated an understanding of the instructions.   The patient was advised to call back or seek an in-person evaluation if the symptoms worsen or if the condition fails to improve as anticipated.  I provided 15 minutes of non-face-to-face time during this  encounter.   Elveria JAYSON Quarry, NP

## 2024-02-23 ENCOUNTER — Encounter: Payer: Self-pay | Admitting: Nurse Practitioner

## 2024-02-29 ENCOUNTER — Ambulatory Visit: Payer: Self-pay | Admitting: Nurse Practitioner

## 2024-02-29 ENCOUNTER — Telehealth (INDEPENDENT_AMBULATORY_CARE_PROVIDER_SITE_OTHER): Payer: Self-pay | Admitting: Nurse Practitioner

## 2024-02-29 DIAGNOSIS — F419 Anxiety disorder, unspecified: Secondary | ICD-10-CM | POA: Diagnosis not present

## 2024-02-29 DIAGNOSIS — F909 Attention-deficit hyperactivity disorder, unspecified type: Secondary | ICD-10-CM | POA: Diagnosis not present

## 2024-02-29 DIAGNOSIS — F32A Depression, unspecified: Secondary | ICD-10-CM

## 2024-02-29 MED ORDER — AMPHETAMINE-DEXTROAMPHET ER 20 MG PO CP24: 20.0000 mg | ORAL_CAPSULE | ORAL | 0 refills | Status: DC

## 2024-03-01 ENCOUNTER — Encounter: Payer: Self-pay | Admitting: Nurse Practitioner

## 2024-03-01 NOTE — Progress Notes (Signed)
 Virtual Visit via Video Note  I connected with [name] on 03/01/24 at  1:50 PM EDT by a video enabled telemedicine application and verified that I am speaking with the correct person using two identifiers.  Location: Patient: home Provider: office   I discussed the limitations of evaluation and management by telemedicine and the availability of in person appointments. The patient expressed understanding and agreed to proceed.  History of Present Illness: Presents via video to discuss adult ADHD and depression/anxiety.  Although the PHQ-9 and GAD-7 scores are elevated, she states she is doing well on Zoloft  100 mg.  Recently started Adderall XR 10 mg daily.  States this is helping a little bit.  Slight improvement in focusing and completing task.  States the medication covers her shift at work.  Does not take every day.  Does not take it on the weekends or days she is off work.  Denies any adverse effects from medication.    02/29/2024    1:06 PM  Depression screen PHQ 2/9  Decreased Interest 2  Down, Depressed, Hopeless 1  PHQ - 2 Score 3  Altered sleeping 3  Tired, decreased energy 2  Change in appetite 0  Feeling bad or failure about yourself  0  Trouble concentrating 3  Moving slowly or fidgety/restless 2  Suicidal thoughts 0  PHQ-9 Score 13  Difficult doing work/chores Extremely dIfficult      02/29/2024    1:08 PM 02/12/2024    9:55 AM 07/01/2023    2:34 PM 09/03/2022    1:45 PM  GAD 7 : Generalized Anxiety Score  Nervous, Anxious, on Edge 3 3 1 2   Control/stop worrying 3 3 1 2   Worry too much - different things 3 3 1 2   Trouble relaxing 3 3 2 2   Restless 2 3 0 1  Easily annoyed or irritable 3 3 1 2   Afraid - awful might happen 2  1 2   Total GAD 7 Score 19  7 13   Anxiety Difficulty Extremely difficult Somewhat difficult  Very difficult      Observations/Objective: NAD.  Alert, oriented.  Calm and cheerful affect.  Making good eye contact.  Speech clear.   Normal judgment and behavior.  Assessment and Plan: Problem List Items Addressed This Visit       Other   Adult ADHD - Primary   Anxiety and depression   Meds ordered this encounter  Medications   amphetamine -dextroamphetamine (ADDERALL XR) 20 MG 24 hr capsule    Sig: Take 1 capsule (20 mg total) by mouth every morning.    Dispense:  30 capsule    Refill:  0    Supervising Provider:   ALPHONSA HAMILTON A [[id number]]     Follow Up Instructions: Continue sertraline  as directed. Increase Adderall to 20 mg daily.  Again discussed potential adverse effects.  Discontinue medication and contact office if any problems.  Otherwise send a message in 3 to 4 weeks to let us  know how this dosage is working before further refills. Return for folllow up in December as planned.    I discussed the assessment and treatment plan with the patient. The patient was provided an opportunity to ask questions and all were answered. The patient agreed with the plan and demonstrated an understanding of the instructions.   The patient was advised to call back or seek an in-person evaluation if the symptoms worsen or if the condition fails to improve as anticipated.  I provided 15 minutes  of non-face-to-face time during this encounter.   Samantha JAYSON Quarry, NP

## 2024-03-18 ENCOUNTER — Encounter: Payer: Self-pay | Admitting: Nurse Practitioner

## 2024-03-21 ENCOUNTER — Other Ambulatory Visit: Payer: Self-pay | Admitting: Nurse Practitioner

## 2024-03-21 ENCOUNTER — Encounter: Payer: Self-pay | Admitting: Nurse Practitioner

## 2024-03-21 MED ORDER — METHYLPHENIDATE HCL ER 18 MG PO TB24: 18.0000 mg | ORAL_TABLET | Freq: Every day | ORAL | 0 refills | Status: DC

## 2024-03-21 NOTE — Telephone Encounter (Signed)
 Nurses I would recommend that she hold the medicine for now It would be wise to do a follow-up visit with [name] In the meantime forward this message to [name] to see if she wants to adjust the dose downward

## 2024-03-29 ENCOUNTER — Other Ambulatory Visit: Payer: Self-pay | Admitting: Nurse Practitioner

## 2024-03-29 MED ORDER — LISDEXAMFETAMINE DIMESYLATE 10 MG PO CAPS: 10.0000 mg | ORAL_CAPSULE | Freq: Every day | ORAL | 0 refills | Status: DC

## 2024-04-12 ENCOUNTER — Other Ambulatory Visit: Payer: Self-pay | Admitting: Nurse Practitioner

## 2024-04-12 ENCOUNTER — Encounter: Payer: Self-pay | Admitting: Nurse Practitioner

## 2024-04-25 ENCOUNTER — Encounter: Payer: Self-pay | Admitting: Nurse Practitioner

## 2024-04-26 ENCOUNTER — Other Ambulatory Visit: Payer: Self-pay | Admitting: Family Medicine

## 2024-04-26 MED ORDER — LISDEXAMFETAMINE DIMESYLATE 20 MG PO CAPS: 20.0000 mg | ORAL_CAPSULE | Freq: Every day | ORAL | 0 refills | Status: DC

## 2024-05-16 ENCOUNTER — Other Ambulatory Visit (HOSPITAL_COMMUNITY): Payer: Self-pay | Admitting: Adult Health

## 2024-05-16 DIAGNOSIS — Z1231 Encounter for screening mammogram for malignant neoplasm of breast: Secondary | ICD-10-CM

## 2024-06-20 ENCOUNTER — Other Ambulatory Visit: Payer: Self-pay | Admitting: Family Medicine

## 2024-06-20 ENCOUNTER — Ambulatory Visit (HOSPITAL_COMMUNITY)
Admission: RE | Admit: 2024-06-20 | Discharge: 2024-06-20 | Disposition: A | Source: Ambulatory Visit | Attending: Adult Health | Admitting: Adult Health

## 2024-06-20 ENCOUNTER — Encounter (HOSPITAL_COMMUNITY): Payer: Self-pay

## 2024-06-20 DIAGNOSIS — Z1231 Encounter for screening mammogram for malignant neoplasm of breast: Secondary | ICD-10-CM

## 2024-06-21 ENCOUNTER — Other Ambulatory Visit: Payer: Self-pay | Admitting: Family Medicine

## 2024-06-21 NOTE — Telephone Encounter (Unsigned)
 Copied from CRM #[id number]. Topic: Clinical - Medication Refill >> Jun 21, 2024  3:59 PM [name] wrote: Medication:  lisdexamfetamine (VYVANSE ) 20 MG capsule  Has the patient contacted their pharmacy? Yes (Agent: If no, request that the patient contact the pharmacy for the refill. If patient does not wish to contact the pharmacy document the reason why and proceed with request.) (Agent: If yes, when and what did the pharmacy advise?)  This is the patient's preferred pharmacy:  [facility] - [place], [place] - [address] [place] [address] Phone: [phone] Fax: [phone]  Is this the correct pharmacy for this prescription? Yes If no, delete pharmacy and type the correct one.   Has the prescription been filled recently? Yes  Is the patient out of the medication? Yes  Has the patient been seen for an appointment in the last year OR does the patient have an upcoming appointment? Yes  Can we respond through MyChart? Yes  Agent: Please be advised that Rx refills may take up to 3 business days. We ask that you follow-up with your pharmacy.

## 2024-06-23 MED ORDER — LISDEXAMFETAMINE DIMESYLATE 20 MG PO CAPS: 20.0000 mg | ORAL_CAPSULE | Freq: Every day | ORAL | 0 refills | Status: DC

## 2024-06-27 ENCOUNTER — Ambulatory Visit: Payer: Self-pay | Admitting: Adult Health

## 2024-07-08 ENCOUNTER — Ambulatory Visit (INDEPENDENT_AMBULATORY_CARE_PROVIDER_SITE_OTHER): Payer: Self-pay | Admitting: Family Medicine

## 2024-07-08 ENCOUNTER — Encounter: Payer: 59 | Admitting: Family Medicine

## 2024-07-08 VITALS — BP 134/88 | HR 77 | Temp 98.1°F | Ht 68.0 in

## 2024-07-08 DIAGNOSIS — F411 Generalized anxiety disorder: Secondary | ICD-10-CM | POA: Diagnosis not present

## 2024-07-08 DIAGNOSIS — G479 Sleep disorder, unspecified: Secondary | ICD-10-CM

## 2024-07-08 DIAGNOSIS — M7712 Lateral epicondylitis, left elbow: Secondary | ICD-10-CM

## 2024-07-08 DIAGNOSIS — M7711 Lateral epicondylitis, right elbow: Secondary | ICD-10-CM | POA: Diagnosis not present

## 2024-07-08 DIAGNOSIS — F909 Attention-deficit hyperactivity disorder, unspecified type: Secondary | ICD-10-CM | POA: Diagnosis not present

## 2024-07-08 NOTE — Progress Notes (Signed)
" ° °  Subjective:    Patient ID: [name], female    DOB: 11-18-1975, 48 y.o.   MRN: [id number]  HPI  Room 9  PT is here for follow-up She does have adult ADD She takes her medicine on her workdays She states it does allow her to stay more focused She also relates bilateral elbow pain discomfort especially with gripping been present over the past month seemingly somewhat progressive at times Tried ibuprofen  Is tried warm compresses She does have a hard time sleeping at night she takes melatonin occasionally She does try to avoid caffeine She does have anxiety related issues she takes her medicine she states it does help  Pt has concerns about both elbows constantly aching - started 4 months ago  Review of Systems     Objective:   Physical Exam  General-in no acute distress Eyes-no discharge Lungs-respiratory rate normal, CTA CV-no murmurs,RRR Extremities skin warm dry no edema Neuro grossly normal Behavior normal, alert       Assessment & Plan:  1. Lateral epicondylitis of both elbows (Primary) Recommend cold compresses, ibuprofen , avoid excessive repetitive actions If ongoing trouble notify us  and we can help set up with orthopedics  2. Adult ADHD She only uses ADD medicine when she works she can continue this medication she will let us  know if any side effects or problems She does state the medicine is benefiting her 3. Sleep disturbance Avoid caffeine Melatonin is okay to use for occasional use   4. GAD (generalized anxiety disorder) She relates the serotonin reuptake inhibitor is helping continue this currently follow-up 6 months lab work in 6 months   "

## 2024-07-11 ENCOUNTER — Ambulatory Visit: Admitting: Adult Health

## 2024-08-18 ENCOUNTER — Other Ambulatory Visit: Payer: Self-pay | Admitting: Family Medicine

## 2024-08-18 ENCOUNTER — Encounter: Payer: Self-pay | Admitting: Family Medicine

## 2024-09-20 ENCOUNTER — Ambulatory Visit: Admitting: Adult Health

## 2025-01-06 ENCOUNTER — Ambulatory Visit: Admitting: Family Medicine
